# Patient Record
Sex: Male | Born: 1985 | Race: Black or African American | Hispanic: No | Marital: Single | Smoking: Current every day smoker
Health system: Southern US, Community
[De-identification: ages and names within clinical notes are randomized; demographics above are authoritative.]

## PROBLEM LIST (undated history)

## (undated) DIAGNOSIS — Z8719 Personal history of other diseases of the digestive system: Secondary | ICD-10-CM

## (undated) DIAGNOSIS — K269 Duodenal ulcer, unspecified as acute or chronic, without hemorrhage or perforation: Secondary | ICD-10-CM

## (undated) DIAGNOSIS — D649 Anemia, unspecified: Secondary | ICD-10-CM

## (undated) DIAGNOSIS — S025XXA Fracture of tooth (traumatic), initial encounter for closed fracture: Secondary | ICD-10-CM

## (undated) DIAGNOSIS — S12100A Unspecified displaced fracture of second cervical vertebra, initial encounter for closed fracture: Secondary | ICD-10-CM

## (undated) DIAGNOSIS — S022XXA Fracture of nasal bones, initial encounter for closed fracture: Secondary | ICD-10-CM

## (undated) DIAGNOSIS — Z9289 Personal history of other medical treatment: Secondary | ICD-10-CM

## (undated) DIAGNOSIS — A749 Chlamydial infection, unspecified: Secondary | ICD-10-CM

---

## 2012-05-02 DIAGNOSIS — A749 Chlamydial infection, unspecified: Secondary | ICD-10-CM

## 2012-05-02 HISTORY — DX: Chlamydial infection, unspecified: A74.9

## 2013-01-30 HISTORY — PX: APPENDECTOMY: SHX54

## 2013-03-04 ENCOUNTER — Emergency Department (HOSPITAL_COMMUNITY)
Admission: EM | Admit: 2013-03-04 | Discharge: 2013-03-04 | Disposition: A | Discharge status: Home / Self Care | Payer: BC Managed Care – PPO | Source: Home / Self Care | Attending: Emergency Medicine | Admitting: Emergency Medicine

## 2013-03-04 ENCOUNTER — Encounter (HOSPITAL_COMMUNITY): Payer: Self-pay | Admitting: Emergency Medicine

## 2013-03-04 ENCOUNTER — Other Ambulatory Visit (HOSPITAL_COMMUNITY)
Admission: RE | Admit: 2013-03-04 | Discharge: 2013-03-04 | Disposition: A | Discharge status: Home / Self Care | Payer: BC Managed Care – PPO | Source: Ambulatory Visit | Attending: Emergency Medicine | Admitting: Emergency Medicine

## 2013-03-04 DIAGNOSIS — Z113 Encounter for screening for infections with a predominantly sexual mode of transmission: Secondary | ICD-10-CM | POA: Insufficient documentation

## 2013-03-04 DIAGNOSIS — Z202 Contact with and (suspected) exposure to infections with a predominantly sexual mode of transmission: Secondary | ICD-10-CM

## 2013-03-04 NOTE — ED Notes (Signed)
Reports being treated for and std around the end of aug/early sept.    States at a separate time was told by partner that she tested positive for chlamydia.   Pt is is c/o of a weird sensation but denies penile discharge and any other symptoms.

## 2013-03-04 NOTE — ED Provider Notes (Signed)
Chief Complaint:   Chief Complaint  Patient presents with  . Exposure to STD    History of Present Illness:   Taylor Burke is a 27 year old male whose girlfriend told him in August that she tested positive for Chlamydia. They had been sexually active without protection. He went in and was tested and treated. He completed his course of treatment. At no time did he ever have any symptoms except for a mild sense of irritation at the tip of the penis. He denies any urethral discharge or dysuria. There have been no penile lesions, no inguinal lymphadenopathy, or testicular swelling or tenderness. He denies fevers, chills, skin rash, or joint pain. He has not had HIV or RPR testing done.  Review of Systems:  Other than noted above, the patient denies any of the following symptoms: Systemic:  No fevers chills, aches, weight loss, arthralgias, myalgias, or adenopathy. GI:  No abdominal pain, nausea or vomiting. GU:  No dysuria, penile pain, discharge, itching, dysuria, genital lesions, testicular pain or swelling. Skin:  No rash or itching.  PMFSH:  Past medical history, family history, social history, meds, and allergies were reviewed.  Physical Exam:   Vital signs:  BP 125/57  Pulse 60  Temp(Src) 98 F (36.7 C) (Oral)  Resp 16 Gen:  Alert, oriented, in no distress. Abdomen:  Soft and flat, non-distended, and non-tender.  No organomegaly or mass. Genital:  No urethral discharge, no lesions on the penis, no inguinal adenopathy, no testicular pain, mass, or swelling. Skin:  Warm and dry.  No rash.   Labs:  Urine DNA probe was obtained for gonorrhea, Chlamydia, Trichomonas. Blood serologies for HIV and syphilis also obtained.  Assessment:  The encounter diagnosis was Exposure to STD.  Since he has received adequate treatment, and since he is asymptomatic, I do not think he needs any treatment now, we'll wait and see what results of DNA probes and serologies show.  Plan:   1.  Meds:  The  following meds were prescribed:  There are no discharge medications for this patient.   2.  Patient Education/Counseling:  The patient was given appropriate handouts, self care instructions, and instructed in symptomatic relief.The patient was instructed to inform all sexual contacts, avoid intercourse completely for 2 weeks and then only with a condom.  The patient was told that we would call about all abnormal lab results, and that we would need to report certain kinds of infection to the health department.    3.  Follow up:  The patient was told to follow up if no better in 3 to 4 days, if becoming worse in any way, and given some red flag symptoms such as urethral discharge or pain with urination which would prompt immediate return.  Follow up here if necessary.     Reuben Likes, MD 03/04/13 [phone]

## 2013-03-08 NOTE — ED Notes (Signed)
Patient called to inquire about  lab reports , and after verifying ID,discussed negative findings ; Advised about safer sex practices

## 2014-01-20 ENCOUNTER — Emergency Department (HOSPITAL_COMMUNITY)
Admission: EM | Admit: 2014-01-20 | Discharge: 2014-01-20 | Discharge status: Against Medical Advice | Payer: BC Managed Care – PPO | Source: Home / Self Care

## 2014-01-20 ENCOUNTER — Encounter (HOSPITAL_COMMUNITY): Payer: Self-pay | Admitting: Emergency Medicine

## 2014-01-20 DIAGNOSIS — R0602 Shortness of breath: Secondary | ICD-10-CM | POA: Insufficient documentation

## 2014-01-20 DIAGNOSIS — K2981 Duodenitis with bleeding: Secondary | ICD-10-CM | POA: Diagnosis not present

## 2014-01-20 DIAGNOSIS — D62 Acute posthemorrhagic anemia: Secondary | ICD-10-CM | POA: Diagnosis not present

## 2014-01-20 DIAGNOSIS — F17200 Nicotine dependence, unspecified, uncomplicated: Secondary | ICD-10-CM | POA: Insufficient documentation

## 2014-01-20 NOTE — ED Notes (Signed)
While registration was in the room with pt, pt told her that he was leaving and was going to go somewhere else. Pt walked out of the room with no assistance from staff and left the hospital.

## 2014-01-20 NOTE — ED Notes (Signed)
Pt states that he vomited a small amount of blood today, he's appears to be hyperventilating in triage, no hx of asthma or respiratory problems. He has been like this since this evening.

## 2014-01-20 NOTE — ED Notes (Addendum)
Pt walked out into the hallway by himself and placed himself on the floor facedown. This RN and several other people went to assist patient to a wheelchair and back into a triage room. When inquired as to why patient was laying in the floor patient started to raise his voice and repeatedly cuss at the staff in the hallway. Pt talking in complete sentences and saying that he cannot breathe and asks "why are you asking stupid ass questions". Pt assisted back into a wheelchair and back into a triage room. Pt and visitor made aware that we are waiting on a room in the back and we will take him back to a room.

## 2014-01-21 ENCOUNTER — Inpatient Hospital Stay (HOSPITAL_COMMUNITY)
Admission: EM | Admit: 2014-01-21 | Discharge: 2014-01-24 | DRG: 378 | Disposition: A | Discharge status: Home / Self Care | Payer: BC Managed Care – PPO | Attending: Internal Medicine | Admitting: Internal Medicine

## 2014-01-21 ENCOUNTER — Inpatient Hospital Stay (HOSPITAL_COMMUNITY): Payer: BC Managed Care – PPO

## 2014-01-21 ENCOUNTER — Encounter (HOSPITAL_COMMUNITY): Payer: Self-pay | Admitting: Emergency Medicine

## 2014-01-21 DIAGNOSIS — Z9181 History of falling: Secondary | ICD-10-CM | POA: Diagnosis not present

## 2014-01-21 DIAGNOSIS — K264 Chronic or unspecified duodenal ulcer with hemorrhage: Secondary | ICD-10-CM | POA: Diagnosis present

## 2014-01-21 DIAGNOSIS — K5000 Crohn's disease of small intestine without complications: Secondary | ICD-10-CM | POA: Diagnosis present

## 2014-01-21 DIAGNOSIS — R55 Syncope and collapse: Secondary | ICD-10-CM | POA: Diagnosis present

## 2014-01-21 DIAGNOSIS — Z9289 Personal history of other medical treatment: Secondary | ICD-10-CM

## 2014-01-21 DIAGNOSIS — K2940 Chronic atrophic gastritis without bleeding: Secondary | ICD-10-CM | POA: Diagnosis present

## 2014-01-21 DIAGNOSIS — K2981 Duodenitis with bleeding: Principal | ICD-10-CM | POA: Diagnosis present

## 2014-01-21 DIAGNOSIS — J320 Chronic maxillary sinusitis: Secondary | ICD-10-CM | POA: Diagnosis present

## 2014-01-21 DIAGNOSIS — D62 Acute posthemorrhagic anemia: Secondary | ICD-10-CM | POA: Diagnosis present

## 2014-01-21 DIAGNOSIS — F17200 Nicotine dependence, unspecified, uncomplicated: Secondary | ICD-10-CM | POA: Diagnosis present

## 2014-01-21 DIAGNOSIS — S0990XA Unspecified injury of head, initial encounter: Secondary | ICD-10-CM

## 2014-01-21 DIAGNOSIS — K269 Duodenal ulcer, unspecified as acute or chronic, without hemorrhage or perforation: Secondary | ICD-10-CM

## 2014-01-21 DIAGNOSIS — D649 Anemia, unspecified: Secondary | ICD-10-CM

## 2014-01-21 DIAGNOSIS — K2990 Gastroduodenitis, unspecified, without bleeding: Secondary | ICD-10-CM

## 2014-01-21 DIAGNOSIS — Z7982 Long term (current) use of aspirin: Secondary | ICD-10-CM

## 2014-01-21 DIAGNOSIS — K2970 Gastritis, unspecified, without bleeding: Secondary | ICD-10-CM

## 2014-01-21 DIAGNOSIS — K922 Gastrointestinal hemorrhage, unspecified: Secondary | ICD-10-CM | POA: Diagnosis present

## 2014-01-21 DIAGNOSIS — R51 Headache: Secondary | ICD-10-CM | POA: Diagnosis present

## 2014-01-21 DIAGNOSIS — Z8719 Personal history of other diseases of the digestive system: Secondary | ICD-10-CM

## 2014-01-21 HISTORY — DX: Personal history of other medical treatment: Z92.89

## 2014-01-21 HISTORY — DX: Chlamydial infection, unspecified: A74.9

## 2014-01-21 HISTORY — DX: Personal history of other diseases of the digestive system: Z87.19

## 2014-01-21 HISTORY — DX: Anemia, unspecified: D64.9

## 2014-01-21 HISTORY — DX: Duodenal ulcer, unspecified as acute or chronic, without hemorrhage or perforation: K26.9

## 2014-01-21 LAB — CBC
HEMATOCRIT: 24.4 % — AB (ref 39.0–52.0)
HEMATOCRIT: 26.9 % — AB (ref 39.0–52.0)
Hemoglobin: 8.2 g/dL — ABNORMAL LOW (ref 13.0–17.0)
Hemoglobin: 9 g/dL — ABNORMAL LOW (ref 13.0–17.0)
MCH: 30 pg (ref 26.0–34.0)
MCH: 30.3 pg (ref 26.0–34.0)
MCHC: 33.6 g/dL (ref 30.0–36.0)
MCHC: 33.5 g/dL (ref 30.0–36.0)
MCV: 89.4 fL (ref 78.0–100.0)
MCV: 90.6 fL (ref 78.0–100.0)
Platelets: 164 10*3/uL (ref 150–400)
Platelets: 183 10*3/uL (ref 150–400)
RBC: 2.73 MIL/uL — ABNORMAL LOW (ref 4.22–5.81)
RBC: 2.97 MIL/uL — AB (ref 4.22–5.81)
RDW: 13.2 % (ref 11.5–15.5)
RDW: 13.1 % (ref 11.5–15.5)
WBC: 7.4 10*3/uL (ref 4.0–10.5)
WBC: 9.1 10*3/uL (ref 4.0–10.5)

## 2014-01-21 LAB — GLUCOSE, CAPILLARY
GLUCOSE-CAPILLARY: 75 mg/dL (ref 70–99)
Glucose-Capillary: 80 mg/dL (ref 70–99)

## 2014-01-21 LAB — COMPREHENSIVE METABOLIC PANEL
ALBUMIN: 2.7 g/dL — AB (ref 3.5–5.2)
ALT: 8 U/L (ref 0–53)
ALT: 7 U/L (ref 0–53)
AST: 12 U/L (ref 0–37)
AST: 9 U/L (ref 0–37)
Albumin: 3.1 g/dL — ABNORMAL LOW (ref 3.5–5.2)
Alkaline Phosphatase: 34 U/L — ABNORMAL LOW (ref 39–117)
Alkaline Phosphatase: 31 U/L — ABNORMAL LOW (ref 39–117)
Anion gap: 13 (ref 5–15)
Anion gap: 9 (ref 5–15)
BUN: 29 mg/dL — ABNORMAL HIGH (ref 6–23)
BUN: 20 mg/dL (ref 6–23)
CALCIUM: 8 mg/dL — AB (ref 8.4–10.5)
CALCIUM: 7.6 mg/dL — AB (ref 8.4–10.5)
CHLORIDE: 105 meq/L (ref 96–112)
CO2: 23 mEq/L (ref 19–32)
CO2: 25 mEq/L (ref 19–32)
CREATININE: 1.01 mg/dL (ref 0.50–1.35)
CREATININE: 0.88 mg/dL (ref 0.50–1.35)
Chloride: 108 mEq/L (ref 96–112)
GFR calc Af Amer: >90 mL/min (ref >90)
GFR calc non Af Amer: >90 mL/min (ref >90)
GLUCOSE: 115 mg/dL — AB (ref 70–99)
Glucose, Bld: 84 mg/dL (ref 70–99)
POTASSIUM: 3.7 meq/L (ref 3.7–5.3)
Potassium: 4.3 mEq/L (ref 3.7–5.3)
Sodium: 141 mEq/L (ref 137–147)
Sodium: 142 mEq/L (ref 137–147)
TOTAL PROTEIN: 4.6 g/dL — AB (ref 6.0–8.3)
Total Bilirubin: 0.5 mg/dL (ref 0.3–1.2)
Total Bilirubin: 0.6 mg/dL (ref 0.3–1.2)
Total Protein: 5.4 g/dL — ABNORMAL LOW (ref 6.0–8.3)

## 2014-01-21 LAB — URINALYSIS, ROUTINE W REFLEX MICROSCOPIC
BILIRUBIN URINE: NEGATIVE
GLUCOSE, UA: NEGATIVE mg/dL
Hgb urine dipstick: NEGATIVE
KETONES UR: 15 mg/dL — AB
Leukocytes, UA: NEGATIVE
Nitrite: NEGATIVE
PH: 7.5 (ref 5.0–8.0)
Protein, ur: NEGATIVE mg/dL
Specific Gravity, Urine: 1.019 (ref 1.005–1.030)
Urobilinogen, UA: 0.2 mg/dL (ref 0.0–1.0)

## 2014-01-21 LAB — RAPID URINE DRUG SCREEN, HOSP PERFORMED
AMPHETAMINES: NOT DETECTED
BARBITURATES: NOT DETECTED
BENZODIAZEPINES: NOT DETECTED
Cocaine: NOT DETECTED
Opiates: NOT DETECTED
Tetrahydrocannabinol: NOT DETECTED

## 2014-01-21 LAB — ABO/RH: ABO/RH(D): A POS

## 2014-01-21 LAB — CBC WITH DIFFERENTIAL/PLATELET
BASOS ABS: 0 10*3/uL (ref 0.0–0.1)
Basophils Relative: 0 % (ref 0–1)
Eosinophils Absolute: 0 10*3/uL (ref 0.0–0.7)
Eosinophils Relative: 0 % (ref 0–5)
HCT: 32 % — ABNORMAL LOW (ref 39.0–52.0)
HEMOGLOBIN: 10.7 g/dL — AB (ref 13.0–17.0)
Lymphocytes Relative: 9 % — ABNORMAL LOW (ref 12–46)
Lymphs Abs: 1.3 10*3/uL (ref 0.7–4.0)
MCH: 29.5 pg (ref 26.0–34.0)
MCHC: 33.4 g/dL (ref 30.0–36.0)
MCV: 88.2 fL (ref 78.0–100.0)
Monocytes Absolute: 1.2 10*3/uL — ABNORMAL HIGH (ref 0.1–1.0)
Monocytes Relative: 8 % (ref 3–12)
NEUTROS ABS: 12 10*3/uL — AB (ref 1.7–7.7)
NEUTROS PCT: 83 % — AB (ref 43–77)
PLATELETS: 171 10*3/uL (ref 150–400)
RBC: 3.63 MIL/uL — ABNORMAL LOW (ref 4.22–5.81)
RDW: 12.8 % (ref 11.5–15.5)
WBC: 14.5 10*3/uL — ABNORMAL HIGH (ref 4.0–10.5)

## 2014-01-21 LAB — MRSA PCR SCREENING: MRSA BY PCR: NEGATIVE

## 2014-01-21 LAB — PREPARE RBC (CROSSMATCH)

## 2014-01-21 LAB — LIPASE, BLOOD: LIPASE: 14 U/L (ref 11–59)

## 2014-01-21 LAB — I-STAT CG4 LACTIC ACID, ED: Lactic Acid, Venous: 2.42 mmol/L — ABNORMAL HIGH (ref 0.5–2.2)

## 2014-01-21 LAB — POC OCCULT BLOOD, ED: FECAL OCCULT BLD: POSITIVE — AB

## 2014-01-21 MED ORDER — ONDANSETRON HCL 4 MG PO TABS: 4.0000 mg | ORAL_TABLET | Freq: Four times a day (QID) | ORAL | Status: DC | PRN

## 2014-01-21 MED ORDER — SODIUM CHLORIDE 0.9 % IV SOLN
Freq: Once | INTRAVENOUS | Status: AC
  Administered 2014-01-21: 20 mL/h via INTRAVENOUS

## 2014-01-21 MED ORDER — SODIUM CHLORIDE 0.9 % IV BOLUS (SEPSIS)
1000.0000 mL | Freq: Once | INTRAVENOUS | Status: AC
  Administered 2014-01-21: 1000 mL via INTRAVENOUS

## 2014-01-21 MED ORDER — SODIUM CHLORIDE 0.9 % IV BOLUS (SEPSIS): 1000.0000 mL | Freq: Once | INTRAVENOUS | Status: DC

## 2014-01-21 MED ORDER — KETOROLAC TROMETHAMINE 30 MG/ML IJ SOLN
30.0000 mg | Freq: Once | INTRAMUSCULAR | Status: AC
  Administered 2014-01-21: 30 mg via INTRAVENOUS
  Filled 2014-01-21: qty 1

## 2014-01-21 MED ORDER — SODIUM CHLORIDE 0.9 % IV SOLN
Freq: Once | INTRAVENOUS | Status: AC
  Administered 2014-01-21: 10 mL/h via INTRAVENOUS

## 2014-01-21 MED ORDER — SODIUM CHLORIDE 0.9 % IV SOLN
8.0000 mg/h | INTRAVENOUS | Status: DC
  Administered 2014-01-21 – 2014-01-22 (×4): 8 mg/h via INTRAVENOUS
  Filled 2014-01-21 (×8): qty 80

## 2014-01-21 MED ORDER — ONDANSETRON HCL 4 MG/2ML IJ SOLN
4.0000 mg | Freq: Once | INTRAMUSCULAR | Status: AC
  Administered 2014-01-21: 4 mg via INTRAVENOUS
  Filled 2014-01-21: qty 2

## 2014-01-21 MED ORDER — ONDANSETRON HCL 4 MG/2ML IJ SOLN: 4.0000 mg | Freq: Four times a day (QID) | INTRAMUSCULAR | Status: DC | PRN

## 2014-01-21 MED ORDER — ACETAMINOPHEN 325 MG PO TABS
650.0000 mg | ORAL_TABLET | Freq: Four times a day (QID) | ORAL | Status: DC | PRN
  Administered 2014-01-21 (×2): 650 mg via ORAL
  Filled 2014-01-21 (×3): qty 2

## 2014-01-21 MED ORDER — SODIUM CHLORIDE 0.9 % IV SOLN
80.0000 mg | Freq: Once | INTRAVENOUS | Status: AC
  Administered 2014-01-21: 80 mg via INTRAVENOUS
  Filled 2014-01-21: qty 80

## 2014-01-21 MED ORDER — SODIUM CHLORIDE 0.9 % IV SOLN
INTRAVENOUS | Status: DC
  Administered 2014-01-22: 500 mL via INTRAVENOUS

## 2014-01-21 MED ORDER — ACETAMINOPHEN 650 MG RE SUPP: 650.0000 mg | Freq: Four times a day (QID) | RECTAL | Status: DC | PRN

## 2014-01-21 MED ORDER — SODIUM CHLORIDE 0.9 % IV SOLN
INTRAVENOUS | Status: AC
  Administered 2014-01-21 – 2014-01-22 (×3): via INTRAVENOUS

## 2014-01-21 NOTE — ED Notes (Signed)
Family at bedside. 

## 2014-01-21 NOTE — ED Notes (Signed)
Per EMS: pt coming from home with c/o abdominal pain, GI bleeding (vomiting and diarrhea), syncope, and hypotension. Pt was found in bathroom by family. Upon EMS arrival pt was pale, diaphoretic, gross amounts of bright red blood in toilet, BP 50 palpated. Pt went to [place] for similar symptoms, left without being seen. Pt reports abdominal pain for nine months. Pt now Pt A&Ox4, respirations equal and unlabored, skin cool and dry

## 2014-01-21 NOTE — H&P (Signed)
[facility] History and Physical  Taylor Burke LSL:[id number] DOB: Sep 26, 1985 DOA: 01/21/2014  Referring physician: ER physician. PCP: No primary provider on file.   Chief Complaint: Throwing up blood and loss of consciousness.  HPI: Taylor Burke is a 28 y.o. male with no significant past medical history started throwing up blood yesterday around noontime. Patient initially had gone to the [place] long ER but left back home. When he reached back home he again had a syncopal episode. He had no bowel movement which was dark and eventually bloody. Patient was feeling weak and tired. EMS was called and patient blood pressure was initially found to be in the 50 systolic which improved with IV fluid bolus. Patient's hemoglobin in the ER was around 10. Patient states that he also has been having some abdominal discomfort. Patient states for the last one week she has been having some headaches for which he is initially taken some aspirin and then he has been taking ibuprofen. Patient denies any previous history of GI bleed. Patient's headache is global. Patient on exam is nonfocal abdomen appears benign.   Review of Systems: As presented in the history of presenting illness, rest negative.  Past Medical History  Diagnosis Date  . Medical history non-contributory    Past Surgical History  Procedure Laterality Date  . Appendectomy     Social History:  reports that he has been smoking Cigarettes.  He has been smoking about 0.50 packs per day. He does not have any smokeless tobacco history on file. He reports that he does not drink alcohol or use illicit drugs. Where does patient live home. Can patient participate in ADLs? Yes.  No Known Allergies  Family History:  Family History  Problem Relation Age of Onset  . Colon cancer Neg Hx       Prior to Admission medications   Medication Sig Start Date End Date Taking? Authorizing Provider  acetaminophen (TYLENOL) 325 MG tablet Take  650 mg by mouth every 6 (six) hours as needed for moderate pain.   Yes Historical Provider, MD  aspirin 325 MG tablet Take 975 mg by mouth every 6 (six) hours as needed for moderate pain.   Yes Historical Provider, MD  ibuprofen (ADVIL,MOTRIN) 200 MG tablet Take 400 mg by mouth every 6 (six) hours as needed for moderate pain.   Yes Historical Provider, MD    Physical Exam: Filed Vitals:   01/21/14 1157 01/21/14 0343 01/21/14 0345 01/21/14 0347  BP: 93/39 89/34 85/33  82/30  Pulse: 84 81 82 77  Resp: 23 13 15 16   Height:      Weight:      SpO2: 100% 100% 99% 100%     General:  Well-developed and nourished.  Eyes: Anicteric no pallor.  ENT: No discharge from the ears eyes nose mouth.  Neck: No mass felt.  Cardiovascular: S1-S2 heard.  Respiratory: No rhonchi or crepitations.  Abdomen: Soft nontender bowel sounds present. No guarding or rigidity.  Skin: No rash.  Musculoskeletal: No edema.  Psychiatric: Appears normal.  Neurologic: Alert awake oriented to time place and person. Moves all extremities 5 x 5.  Labs on Admission:  Basic Metabolic Panel:  Recent Labs Lab 01/21/14 0128  NA 141  K 4.3  CL 105  CO2 23  GLUCOSE 115*  BUN 29*  CREATININE 1.01  CALCIUM 8.0*   Liver Function Tests:  Recent Labs Lab 01/21/14 0128  AST 12  ALT 8  ALKPHOS 34*  BILITOT 0.5  PROT 5.4*  ALBUMIN 3.1*    Recent Labs Lab 01/21/14 0128  LIPASE 14   No results found for this basename: AMMONIA,  in the last 168 hours CBC:  Recent Labs Lab 01/21/14 0128  WBC 14.5*  NEUTROABS 12.0*  HGB 10.7*  HCT 32.0*  MCV 88.2  PLT 171   Cardiac Enzymes: No results found for this basename: CKTOTAL, CKMB, CKMBINDEX, TROPONINI,  in the last 168 hours  BNP (last 3 results) No results found for this basename: PROBNP,  in the last 8760 hours CBG: No results found for this basename: GLUCAP,  in the last 168 hours  Radiological Exams on Admission: No results  found.  Assessment/Plan Principal Problem:   Acute GI bleeding Active Problems:   Acute blood loss anemia   1. Acute GI bleed - most likely upper GI. The patient also has been having passing some blood through rectum also. At this time we will keep patient n.p.o. and patient has been already started on Protonix infusion. Continue with aggressive IV fluid hydration and closely follow CBC. Transfuse if hemoglobin falls less than 7 or if patient becomes hypotensive. Consult GI. 2. Acute blood loss anemia - closely follow CBC. 3. Leukocytosis - probably reactionary. 4. Headaches - will need further workup after stabilization of his GI bleed. 5. Syncope - probably from #1. 6. Tobacco abuse - tobacco cessation counseling requested.    Code Status: Full code.  Family Communication: Patient's dad at the bedside.  Disposition Plan: Admit to inpatient.    Sherlynn Tourville N. [facility] Pager [phone].  If 7PM-7AM, please contact night-coverage [email or web address] Password [facility] 01/21/2014, 3:52 AM

## 2014-01-21 NOTE — ED Provider Notes (Signed)
CSN: [id number]     Arrival date & time 01/21/14  0119 History   First MD Initiated Contact with Patient 01/21/14 0130     Chief Complaint  Patient presents with  . Abdominal Pain  . GI Bleeding  . Hypotension     (Consider location/radiation/quality/duration/timing/severity/associated sxs/prior Treatment) Patient is a 28 y.o. male presenting with abdominal pain. The history is provided by the patient and the EMS personnel.  Abdominal Pain He was brought to the ED by Mitzi Hansen ends after having a syncopal episode at home. He had been vomiting blood and having black, tarry bowel movements at home. Emesis was bright red blood with clots. He has states that he started having upper abdominal pain and bloody emesis and black bowel movements at about 5 PM. There was some radiation of pain into his back. He's been having headaches for the last week and has been taking aspirin and ibuprofen intermittently but it is not sound like he had a large dose of either. He went to [facility] emergency department but left there before being seen by a physician. At home, and he had a syncopal episode in the bathroom. EMS noted that he was diaphoretic and hypotensive with blood pressure in the 50s and he was noted to be tachycardic. He has been given IV fluids with improvement in blood pressure and heart rate and is no longer diaphoretic. He has been having problems with abdominal pain over the last year or related to a complicated case of appendicitis. He drinks occasionally and smokes black and mild cigars occasionally. He denies any routine medication and denies drug use.  No past medical history on file. No past surgical history on file. No family history on file. History  Substance Use Topics  . Smoking status: Current Every Day Smoker -- 0.50 packs/day    Types: Cigarettes  . Smokeless tobacco: Not on file  . Alcohol Use: No    Review of Systems  Gastrointestinal: Positive for abdominal pain.   All other systems reviewed and are negative.     Allergies  Review of patient's allergies indicates no known allergies.  Home Medications   Prior to Admission medications   Medication Sig Start Date End Date Taking? Authorizing Provider  acetaminophen (TYLENOL) 325 MG tablet Take 650 mg by mouth every 6 (six) hours as needed for moderate pain.    Historical Provider, MD  aspirin 325 MG tablet Take 975 mg by mouth every 6 (six) hours as needed for moderate pain.    Historical Provider, MD  ibuprofen (ADVIL,MOTRIN) 200 MG tablet Take 400 mg by mouth every 6 (six) hours as needed for moderate pain.    Historical Provider, MD   BP 99/55  Pulse 86  Resp 14  Ht 6' (1.829 m)  Wt 176 lb (79.833 kg)  BMI 23.86 kg/m2  SpO2 100% Physical Exam  Nursing note and vitals reviewed.  28 year old male, resting comfortably and in no acute distress. Vital signs are significant for borderline hypertension. He is noted to be shivering but is not diaphoretic. Oxygen saturation is 100%, which is normal. Head is normocephalic and atraumatic. PERRLA, EOMI. Oropharynx is clear. Neck is nontender and supple without adenopathy or JVD. Back is nontender and there is no CVA tenderness. Lungs are clear without rales, wheezes, or rhonchi. Chest is nontender. Heart has regular rate and rhythm without murmur. Abdomen is soft, flat, with mild epigastric tenderness. There is no rebound or guarding. There are no masses or  hepatosplenomegaly and peristalsis is normoactive. Rectal: Decreased sphincter tone, small amount of bright red blood present. Extremities have no cyanosis or edema, full range of motion is present. Skin is warm and dry without rash. Neurologic: Mental status is normal, cranial nerves are intact, there are no motor or sensory deficits.  ED Course  Procedures (including critical care time) Labs Review Results for orders placed during the hospital encounter of 01/21/14  CBC WITH DIFFERENTIAL       Result Value Ref Range   WBC 14.5 (*) 4.0 - 10.5 K/uL   RBC 3.63 (*) 4.22 - 5.81 MIL/uL   Hemoglobin 10.7 (*) 13.0 - 17.0 g/dL   HCT 32.0 (*) 39.0 - 52.0 %   MCV 88.2  78.0 - 100.0 fL   MCH 29.5  26.0 - 34.0 pg   MCHC 33.4  30.0 - 36.0 g/dL   RDW 12.8  11.5 - 15.5 %   Platelets 171  150 - 400 K/uL   Neutrophils Relative % 83 (*) 43 - 77 %   Neutro Abs 12.0 (*) 1.7 - 7.7 K/uL   Lymphocytes Relative 9 (*) 12 - 46 %   Lymphs Abs 1.3  0.7 - 4.0 K/uL   Monocytes Relative 8  3 - 12 %   Monocytes Absolute 1.2 (*) 0.1 - 1.0 K/uL   Eosinophils Relative 0  0 - 5 %   Eosinophils Absolute 0.0  0.0 - 0.7 K/uL   Basophils Relative 0  0 - 1 %   Basophils Absolute 0.0  0.0 - 0.1 K/uL  LIPASE, BLOOD      Result Value Ref Range   Lipase 14  11 - 59 U/L  COMPREHENSIVE METABOLIC PANEL      Result Value Ref Range   Sodium 141  137 - 147 mEq/L   Potassium 4.3  3.7 - 5.3 mEq/L   Chloride 105  96 - 112 mEq/L   CO2 23  19 - 32 mEq/L   Glucose, Bld 115 (*) 70 - 99 mg/dL   BUN 29 (*) 6 - 23 mg/dL   Creatinine, Ser 1.01  0.50 - 1.35 mg/dL   Calcium 8.0 (*) 8.4 - 10.5 mg/dL   Total Protein 5.4 (*) 6.0 - 8.3 g/dL   Albumin 3.1 (*) 3.5 - 5.2 g/dL   AST 12  0 - 37 U/L   ALT 8  0 - 53 U/L   Alkaline Phosphatase 34 (*) 39 - 117 U/L   Total Bilirubin 0.5  0.3 - 1.2 mg/dL   GFR calc non Af Amer >90  >90 mL/min   GFR calc Af Amer >90  >90 mL/min   Anion gap 13  5 - 15  I-STAT CG4 LACTIC ACID, ED      Result Value Ref Range   Lactic Acid, Venous 2.42 (*) 0.5 - 2.2 mmol/L  POC OCCULT BLOOD, ED      Result Value Ref Range   Fecal Occult Bld POSITIVE (*) NEGATIVE  TYPE AND SCREEN      Result Value Ref Range   ABO/RH(D) A POS     Antibody Screen NEG     Sample Expiration 01/24/2014    ABO/RH      Result Value Ref Range   ABO/RH(D) A POS      EKG Interpretation   Date/Time:  Tuesday January 21 2014 01:27:42 EDT Ventricular Rate:  93 PR Interval:  136 QRS Duration: 82 QT Interval:   340 QTC Calculation: 423 R Axis:  34 Text Interpretation:  Age not entered, assumed to be  29 years old for  purpose of ECG interpretation Sinus rhythm Abnormal R-wave progression,  early transition Early repolarization No old tracing to compare Confirmed  by [facility]  MD, Latrina Guttman ([id number]) on 01/21/2014 1:39:51 AM      CRITICAL CARE Performed by: LYYTK,PTWSF Total critical care time: 45 minutes Critical care time was exclusive of separately billable procedures and treating other patients. Critical care was necessary to treat or prevent imminent or life-threatening deterioration. Critical care was time spent personally by me on the following activities: development of treatment plan with patient and/or surrogate as well as nursing, discussions with consultants, evaluation of patient's response to treatment, examination of patient, obtaining history from patient or surrogate, ordering and performing treatments and interventions, ordering and review of laboratory studies, ordering and review of radiographic studies, pulse oximetry and re-evaluation of patient's condition.  MDM   Final diagnoses:  Upper gastrointestinal bleed    Upper GI bleed with episode of hypotension. Blood pressure and heart rate are adequate in the ED currently. He is started on pantoprazole intravenously. Old records were reviewed and he was tachycardic when at the ED at [facility], but was not hypotensive.  He has been hemodynamically stable in the ED. No further episodes of hypotension or tachycardia. Hemoglobin is somewhat low at 10.7 without any baseline for comparison., In an otherwise healthy male, would expect hemoglobin to be over 14 at baseline. I do not see an indication for emergent endoscopy. Case is discussed with Dr. Hal Hope outside hospital as he agrees to admit the patient to step down unit.  Delora Fuel, MD 68/12/75 1700

## 2014-01-21 NOTE — ED Notes (Signed)
Pt alert, NAD, calm, interactive, resps e/u, speaking in clear complete sentences, family at [facility] x2. C/o HA and feeling weak. (Denies: sob other pain, nausea or dizziness). IVF bolus initiated/ infusing.

## 2014-01-21 NOTE — Progress Notes (Signed)
[place] TEAM 1 - Stepdown/ICU TEAM Progress Note  Taylor Burke PNT:[id number] DOB: [birth year] DOA: 01/21/2014 PCP: No PCP Per Patient  Admit HPI / Brief Narrative: Taylor Burke is a 28 y.o. BM PMHx S./P. appendectomy (possible polyps?) Surgery performed in [place]. Started throwing up blood yesterday around noontime. Patient initially had gone to the [place] long ER but left back home. When he reached back home he again had a syncopal episode. He had no bowel movement which was dark and eventually bloody. Patient was feeling weak and tired. EMS was called and patient blood pressure was initially found to be in the 50 systolic which improved with IV fluid bolus. Patient's hemoglobin in the ER was around 10. Patient states that he also has been having some abdominal discomfort. Patient states for the last one week she has been having some headaches for which he is initially taken some aspirin and then he has been taking ibuprofen. Patient denies any previous history of GI bleed. Patient's headache is global. Patient on exam is nonfocal abdomen appears benign.    HPI/Subjective: 9/22 A/O. x4, continued feeling of dizziness when sitting/standing. States multiple syncopal events yesterday after returning from [facility]. States he struck his head multiple times, currently C./O HA.  Assessment/Plan: Symptomatic GI bleed/acute blood loss anemia -Have consulted GI. Counseled patient that most likely will require colonoscopy in the a.m. following bowel prep tonight. -Continue normal saline at 170ml/hr -Continue Protonix drip -Type and cross -H./H. QID, target hemoglobin > 7.  Multiple head trauma -Head CT  Headache -Toradol 30 mg IV x1     Code Status: FULL Family Communication: no family present at time of exam Disposition Plan: Resolution of GI bleed    Consultants: GI pending  Procedure/Significant Events: Head CT  pending   Culture NA  Antibiotics: NA  DVT prophylaxis: SCD   Devices NA   LINES / TUBES:      Continuous Infusions: . sodium chloride 150 mL/hr at 01/21/14 0900  . pantoprozole (PROTONIX) infusion 8 mg/hr (01/21/14 0600)    Objective: VITAL SIGNS: Temp: 98.6 F (37 C) (09/22 0741) Temp src: Oral (09/22 0741) BP: 108/48 mmHg (09/22 1000) Pulse Rate: 58 (09/22 1000) SPO2;100% on RA FIO2:   Intake/Output Summary (Last 24 hours) at 01/21/14 1210 Last data filed at 01/21/14 0602  Gross per 24 hour  Intake   2675 ml  Output    120 ml  Net   2555 ml     Exam: General: A/O x 4, Dizzy with sitting/standing, No acute respiratory distress Lungs: Clear to auscultation bilaterally without wheezes or crackles Cardiovascular: Regular rate and rhythm without murmur gallop or rub normal S1 and S2 Abdomen: Nontender, nondistended, soft, bowel sounds positive, no rebound, no ascites, no appreciable mass Extremities: No significant cyanosis, clubbing, or edema bilateral lower extremities Neurologic; pupils equal round reactive to light and accommodation, tongue/uvula midline, extremities strength 5/5, sensation intact, quick finger touch is within normal limit, did not ambulate patient secondary to presyncope.  Data Reviewed: Basic Metabolic Panel:  Recent Labs Lab 01/21/14 0128 01/21/14 1015  NA 141 142  K 4.3 3.7  CL 105 108  CO2 23 25  GLUCOSE 115* 84  BUN 29* 20  CREATININE 1.01 0.88  CALCIUM 8.0* 7.6*   Liver Function Tests:  Recent Labs Lab 01/21/14 0128 01/21/14 1015  AST 12 9  ALT 8 7  ALKPHOS 34* 31*  BILITOT 0.5 0.6  PROT 5.4* 4.6*  ALBUMIN 3.1* 2.7*  Recent Labs Lab 01/21/14 0128  LIPASE 14   No results found for this basename: AMMONIA,  in the last 168 hours CBC:  Recent Labs Lab 01/21/14 0128 01/21/14 1015  WBC 14.5* 7.4  NEUTROABS 12.0*  --   HGB 10.7* 8.2*  HCT 32.0* 24.4*  MCV 88.2 89.4  PLT 171 164   Cardiac  Enzymes: No results found for this basename: CKTOTAL, CKMB, CKMBINDEX, TROPONINI,  in the last 168 hours BNP (last 3 results) No results found for this basename: PROBNP,  in the last 8760 hours CBG: No results found for this basename: GLUCAP,  in the last 168 hours  Recent Results (from the past 240 hour(s))  MRSA PCR SCREENING     Status: None   Collection Time    01/21/14  5:45 AM      Result Value Ref Range Status   MRSA by PCR NEGATIVE  NEGATIVE Final   Comment:            The GeneXpert MRSA Assay (FDA     approved for NASAL specimens     only), is one component of a     comprehensive MRSA colonization     surveillance program. It is not     intended to diagnose MRSA     infection nor to guide or     monitor treatment for     MRSA infections.     Studies:  Recent x-ray studies have been reviewed in detail by the Attending Physician  Scheduled Meds:  Scheduled Meds:   Time spent on care of this patient: 40 mins   Allie Bossier , MD   [facility] Office  [phone] Pager - [phone]  On-Call/Text Page:      [email or web address]      password [email or web address]  If 7PM-7AM, please contact night-coverage [email or web address] Password [email or web address] 01/21/2014, 12:10 PM   LOS: 0 days

## 2014-01-21 NOTE — Progress Notes (Signed)
Utilization Review Completed.Donne Anon T9/22/2015

## 2014-01-21 NOTE — Progress Notes (Signed)
Pt just got a bed on 3W, pt just had a bloody bm and his hgb was dropped since this am; another cbc was just drawn; awaiting results before I transfer patient; md notified

## 2014-01-21 NOTE — Consult Note (Signed)
Referring Provider: [facility] Primary Care Physician:  No PCP Per Patient Primary Gastroenterologist:  unassigned  Reason for Consultation:  Anemia, hematemesis   HPI: Taylor Burke is a 28 y.o. male admitted through the ER due to hematemesis, anemia, and a syncopal episode. Pt reports a 9 month hx of intermittent abdominal pain. He says he threw up blood yesterday around 9 pm. He went to [facility] ER but left withoutbeing seen.  He went home and had a syncopal episode. He then had a BM that was dark and then bloody.  EMS was called and pt was found to have a systolic BP of 50. Pt says for the past week he has been having headaches and has used aspirin and ibuprofen.Pts Hgb is 8.2.      Pt says that last Oct or Nov he had RLQ pain and was seen at [facility] in [place], [place]. He says he had his appendix out, and "something else near it was messed up and they took it out". His significant other thinks it was the iliocecal valve. He says he has had RLQ pain since. He was seen in [place] in Feb 2016 and had a CT scan: 1. There is increased density in the fat in the left paracolic gutter anterior to the descending colon without abnormality descending colon or small bowel. This is not free fluid and is not a mass with mass effect. Suggest short-term 3 month followup  exam.     Past Medical History  Diagnosis Date  . Medical history non-contributory     Past Surgical History  Procedure Laterality Date  . Appendectomy      Prior to Admission medications   Medication Sig Start Date End Date Taking? Authorizing Provider  acetaminophen (TYLENOL) 325 MG tablet Take 650 mg by mouth every 6 (six) hours as needed for moderate pain.   Yes Historical Provider, MD  aspirin 325 MG tablet Take 975 mg by mouth every 6 (six) hours as needed for moderate pain.   Yes Historical Provider, MD  ibuprofen (ADVIL,MOTRIN) 200 MG tablet Take 400 mg by mouth every 6 (six) hours as needed for  moderate pain.   Yes Historical Provider, MD    Current Facility-Administered Medications  Medication Dose Route Frequency Provider Last Rate Last Dose  . 0.9 %  sodium chloride infusion   Intravenous Continuous Rise Patience, MD 150 mL/hr at 01/21/14 0900    . acetaminophen (TYLENOL) tablet 650 mg  650 mg Oral Q6H PRN Rise Patience, MD   650 mg at 01/21/14 0919   Or  . acetaminophen (TYLENOL) suppository 650 mg  650 mg Rectal Q6H PRN Rise Patience, MD      . ondansetron [facility]) tablet 4 mg  4 mg Oral Q6H PRN Rise Patience, MD       Or  . ondansetron [facility]) injection 4 mg  4 mg Intravenous Q6H PRN Rise Patience, MD      . pantoprazole (PROTONIX) 80 mg in sodium chloride 0.9 % 250 mL (0.32 mg/mL) infusion  8 mg/hr Intravenous Continuous Delora Fuel, MD 25 mL/hr at 01/21/14 1236 8 mg/hr at 01/21/14 1236    Allergies as of 01/21/2014  . (No Known Allergies)    Family History  Problem Relation Age of Onset  . Colon cancer Neg Hx     History   Social History  . Marital Status: Single    Spouse Name: N/A    Number of Children:  N/A  . Years of Education: N/A   Occupational History  . Not on file.   Social History Main Topics  . Smoking status: Current Every Day Smoker -- 0.50 packs/day    Types: Cigarettes  . Smokeless tobacco: Not on file  . Alcohol Use: No  . Drug Use: No  . Sexual Activity: Yes    Birth Control/ Protection: None   Other Topics Concern  . Not on file   Social History Narrative  . No narrative on file    Review of Systems: Gen: Denies any fever, chills, sweats, anorexia, fatigue, weakness, malaise, weight loss, and sleep disorder CV: Denies chest pain, angina, palpitations, syncope, orthopnea, PND, peripheral edema, and claudication. Resp: Denies dyspnea at rest, dyspnea with exercise, cough, sputum, wheezing,, and pleurisy. GI: Denies, jaundice, and fecal incontinence.   Denies dysphagia or odynophagia.Pt has vomited  blood. GU : Denies urinary burning, blood in urine, urinary frequency, urinary hesitancy, nocturnal urination, and urinary incontinence. MS: Denies joint pain, limitation of movement, and swelling, stiffness, low back pain, extremity pain. Denies muscle weakness, cramps, atrophy.  Derm: Denies rash, itching, dry skin, hives, moles, warts, or unhealing ulcers.  Psych: Denies depression, anxiety, memory loss, suicidal ideation, hallucinations, paranoia, and confusion. Heme: Denies bruising, bleeding, and enlarged lymph nodes. Neuro:  Denies any headaches, dizziness, paresthesias. Endo:  Denies any problems with DM, thyroid, adrenal function.  Physical Exam: Vital signs in last 24 hours: Temp:  [98.3 F (36.8 C)-98.6 F (37 C)] 98.5 F (36.9 C) (09/22 1200) Pulse Rate:  [58-126] 67 (09/22 1200) Resp:  [9-36] 16 (09/22 1200) BP: (82-140)/(25-97) 96/47 mmHg (09/22 1200) SpO2:  [98 %-100 %] 100 % (09/22 1200) Weight:  [166 lb 10.7 oz (75.6 kg)-176 lb (79.833 kg)] 166 lb 10.7 oz (75.6 kg) (09/22 0545) Last BM Date: 01/21/14 General:   Alert,  Well-developed, well-nourished, pleasant and cooperative in NAD Head:  Normocephalic and atraumatic. Eyes:  Sclera clear, no icterus.   Conjunctiva pink. Ears:  Normal auditory acuity. Nose:  No deformity, discharge,  or lesions. Mouth:  No deformity or lesions.   Neck:  Supple; no masses or thyromegaly. Lungs:  Clear throughout to auscultation.   No wheezes, crackles, or rhonchi. Heart:  Regular rate and rhythm; no murmurs, clicks, rubs,  or gallops. Abdomen:  Soft,nontender, BS active,nonpalp mass or hsm.   Rectal:  Deferred  Msk:  Symmetrical without gross deformities. . Pulses:  Normal pulses noted. Extremities:  Without clubbing or edema. Neurologic:  Alert and  oriented x4;  grossly normal neurologically. Skin:  Intact without significant lesions or rashes.. Psych:  Alert and cooperative. Normal mood and affect.  Intake/Output from previous  day: 09/21 0701 - 09/22 0700 In: 2675 [I.V.:1675; IV Piggyback:1000] Out: 120 [Urine:120] Intake/Output this shift:    Lab Results:  Recent Labs  01/21/14 0128 01/21/14 1015  WBC 14.5* 7.4  HGB 10.7* 8.2*  HCT 32.0* 24.4*  PLT 171 164  CBC fron 06/17/13 Hgb 14 HCT 42.5   BMET  Recent Labs  01/21/14 0128 01/21/14 1015  NA 141 142  K 4.3 3.7  CL 105 108  CO2 23 25  GLUCOSE 115* 84  BUN 29* 20  CREATININE 1.01 0.88  CALCIUM 8.0* 7.6*   LFT  Recent Labs  01/21/14 1015  PROT 4.6*  ALBUMIN 2.7*  AST 9  ALT 7  ALKPHOS 31*  BILITOT 0.6     Studies/Results: CT abd from [facility] on2/16/15: IMPRESSION: 1. There is increased density  in the fat in the left paracolic gutter anterior to the descending colon without abnormality descending colon or small bowel. This is not free fluid and is not a mass with mass effect. Suggest short-term 3 month followup  exam. 2. Otherwise negative exam     IMPRESSION/PLAN: 1. Acute GI bleed with hematemesis and subsequent dark stool. Likely UGI bleed, ? If due to NSAIDS causing gastritis or ulcers, ? Mallory-Weiss tear? Continue IVF. Monitor H/H. Pt to be scheduled for EGD tomorrow morning. Continue protonix infusion. Will have pt sign med release to try to get records from [facility] regarding previous surgery.Orders for additional 1000cc bolus discontinued as it would likely result in further dilutional anemia. Pt currently stable. 2.Syncope. Likely due toUGI bleed.      Hvozdovic, Deloris Ping 01/21/2014,  Pager [phone]   ________________________________________________________________________  Velora Heckler GI MD note:  I personally examined the patient, reviewed the data and agree with the assessment and plan described above.  HAs had melena this afternoon twice.  HD stable. Was put in for transfer to step down but I think he should stay in ICU for now.  I've ordered two units blood transfusion to be given.  Planning on EGD  tomorrow, earlier if needed but he's been HD stable since initial rescusitation and so I think he is unlikely to be actively bleeding.   Owens Loffler, MD [facility] Pager [phone]

## 2014-01-21 NOTE — ED Notes (Signed)
MD at bedside. 

## 2014-01-22 ENCOUNTER — Encounter (HOSPITAL_COMMUNITY)
Admission: EM | Disposition: A | Discharge status: Home / Self Care | Payer: Self-pay | Source: Home / Self Care | Attending: Internal Medicine

## 2014-01-22 ENCOUNTER — Encounter (HOSPITAL_COMMUNITY): Payer: BC Managed Care – PPO | Admitting: Anesthesiology

## 2014-01-22 ENCOUNTER — Inpatient Hospital Stay (HOSPITAL_COMMUNITY): Payer: BC Managed Care – PPO | Admitting: Anesthesiology

## 2014-01-22 ENCOUNTER — Encounter (HOSPITAL_COMMUNITY): Payer: Self-pay | Admitting: *Deleted

## 2014-01-22 DIAGNOSIS — K269 Duodenal ulcer, unspecified as acute or chronic, without hemorrhage or perforation: Secondary | ICD-10-CM

## 2014-01-22 DIAGNOSIS — K2990 Gastroduodenitis, unspecified, without bleeding: Secondary | ICD-10-CM

## 2014-01-22 DIAGNOSIS — K2970 Gastritis, unspecified, without bleeding: Secondary | ICD-10-CM

## 2014-01-22 DIAGNOSIS — K2981 Duodenitis with bleeding: Secondary | ICD-10-CM

## 2014-01-22 HISTORY — PX: ESOPHAGOGASTRODUODENOSCOPY: SHX5428

## 2014-01-22 LAB — TYPE AND SCREEN
ABO/RH(D): A POS
Antibody Screen: NEGATIVE
UNIT DIVISION: 0
Unit division: 0

## 2014-01-22 LAB — CBC
HCT: 25.7 % — ABNORMAL LOW (ref 39.0–52.0)
HCT: 26.6 % — ABNORMAL LOW (ref 39.0–52.0)
Hemoglobin: 8.6 g/dL — ABNORMAL LOW (ref 13.0–17.0)
Hemoglobin: 9 g/dL — ABNORMAL LOW (ref 13.0–17.0)
MCH: 30 pg (ref 26.0–34.0)
MCH: 30.5 pg (ref 26.0–34.0)
MCHC: 33.5 g/dL (ref 30.0–36.0)
MCHC: 33.8 g/dL (ref 30.0–36.0)
MCV: 89.5 fL (ref 78.0–100.0)
MCV: 90.2 fL (ref 78.0–100.0)
PLATELETS: 138 10*3/uL — AB (ref 150–400)
PLATELETS: 150 10*3/uL (ref 150–400)
RBC: 2.87 MIL/uL — AB (ref 4.22–5.81)
RBC: 2.95 MIL/uL — AB (ref 4.22–5.81)
RDW: 13.1 % (ref 11.5–15.5)
RDW: 13.1 % (ref 11.5–15.5)
WBC: 5.8 10*3/uL (ref 4.0–10.5)
WBC: 6.8 10*3/uL (ref 4.0–10.5)

## 2014-01-22 LAB — GLUCOSE, CAPILLARY
GLUCOSE-CAPILLARY: 80 mg/dL (ref 70–99)
Glucose-Capillary: 95 mg/dL (ref 70–99)

## 2014-01-22 SURGERY — EGD (ESOPHAGOGASTRODUODENOSCOPY)
Anesthesia: Monitor Anesthesia Care

## 2014-01-22 MED ORDER — MORPHINE SULFATE 2 MG/ML IJ SOLN
1.0000 mg | Freq: Once | INTRAMUSCULAR | Status: AC
Start: 2014-01-22 — End: 2014-01-22
  Administered 2014-01-22: 1 mg via INTRAVENOUS
  Filled 2014-01-22: qty 1

## 2014-01-22 MED ORDER — DEXTROSE-NACL 5-0.9 % IV SOLN
INTRAVENOUS | Status: DC
  Administered 2014-01-22: 150 mL/h via INTRAVENOUS
  Administered 2014-01-22: 15:00:00 via INTRAVENOUS

## 2014-01-22 MED ORDER — SODIUM CHLORIDE 0.9 % IV SOLN
INTRAVENOUS | Status: DC | PRN
  Administered 2014-01-22: 14:00:00 via INTRAVENOUS

## 2014-01-22 MED ORDER — TRAMADOL HCL 50 MG PO TABS
50.0000 mg | ORAL_TABLET | Freq: Four times a day (QID) | ORAL | Status: DC | PRN
  Administered 2014-01-22 – 2014-01-23 (×3): 50 mg via ORAL
  Filled 2014-01-22 (×3): qty 1

## 2014-01-22 MED ORDER — MIDAZOLAM HCL 5 MG/5ML IJ SOLN
INTRAMUSCULAR | Status: DC | PRN
  Administered 2014-01-22: 2 mg via INTRAVENOUS

## 2014-01-22 MED ORDER — ONDANSETRON HCL 4 MG/2ML IJ SOLN: 4.0000 mg | Freq: Once | INTRAMUSCULAR | Status: DC | PRN

## 2014-01-22 MED ORDER — LIDOCAINE HCL (CARDIAC) 20 MG/ML IV SOLN
INTRAVENOUS | Status: DC | PRN
  Administered 2014-01-22: 30 mg via INTRAVENOUS

## 2014-01-22 MED ORDER — SODIUM CHLORIDE 0.9 % IV SOLN
INTRAVENOUS | Status: DC
  Administered 2014-01-22 (×2): via INTRAVENOUS
  Administered 2014-01-23: 1000 mL via INTRAVENOUS

## 2014-01-22 MED ORDER — OXYCODONE HCL 5 MG PO TABS
5.0000 mg | ORAL_TABLET | ORAL | Status: DC | PRN
  Administered 2014-01-22 – 2014-01-23 (×2): 5 mg via ORAL
  Filled 2014-01-22 (×2): qty 1

## 2014-01-22 MED ORDER — INFLUENZA VAC SPLIT QUAD 0.5 ML IM SUSY
0.5000 mL | PREFILLED_SYRINGE | INTRAMUSCULAR | Status: AC
  Administered 2014-01-23: 0.5 mL via INTRAMUSCULAR
  Filled 2014-01-22: qty 0.5

## 2014-01-22 MED ORDER — PROPOFOL 10 MG/ML IV BOLUS
INTRAVENOUS | Status: DC | PRN
  Administered 2014-01-22: 30 mg via INTRAVENOUS

## 2014-01-22 MED ORDER — FENTANYL CITRATE 0.05 MG/ML IJ SOLN
INTRAMUSCULAR | Status: DC | PRN
  Administered 2014-01-22 (×2): 50 ug via INTRAVENOUS

## 2014-01-22 MED ORDER — PANTOPRAZOLE SODIUM 40 MG PO TBEC
40.0000 mg | DELAYED_RELEASE_TABLET | Freq: Two times a day (BID) | ORAL | Status: DC
  Administered 2014-01-22 – 2014-01-24 (×4): 40 mg via ORAL
  Filled 2014-01-22 (×4): qty 1

## 2014-01-22 MED ORDER — HYDROMORPHONE HCL 1 MG/ML IJ SOLN: 0.2500 mg | INTRAMUSCULAR | Status: DC | PRN

## 2014-01-22 NOTE — Anesthesia Postprocedure Evaluation (Signed)
  Anesthesia Post-op Note  Patient: Taylor Burke  Procedure(s) Performed: Procedure(s): ESOPHAGOGASTRODUODENOSCOPY (EGD) (N/A)  Patient Location: Endoscopy Unit  Anesthesia Type:MAC  Level of Consciousness: awake, alert  and oriented  Airway and Oxygen Therapy: Patient Spontanous Breathing  Post-op Pain: none  Post-op Assessment: Post-op Vital signs reviewed, Patient's Cardiovascular Status Stable, Respiratory Function Stable, Patent Airway and Pain level controlled  Post-op Vital Signs: stable  Last Vitals:  Filed Vitals:   01/22/14 1430  BP: 98/36  Pulse: 60  Temp:   Resp: 18    Complications: No apparent anesthesia complications

## 2014-01-22 NOTE — Anesthesia Preprocedure Evaluation (Signed)
Anesthesia Evaluation  Patient identified by MRN, date of birth, ID band Patient awake    Reviewed: Allergy & Precautions, H&P , Patient's Chart, lab work & pertinent test results  Airway Mallampati: I TM Distance: >3 FB Neck ROM: Full    Dental  (+) Teeth Intact, Dental Advisory Given   Pulmonary Current Smoker,  breath sounds clear to auscultation        Cardiovascular Rhythm:Regular Rate:Normal     Neuro/Psych    GI/Hepatic   Endo/Other    Renal/GU      Musculoskeletal   Abdominal   Peds  Hematology   Anesthesia Other Findings   Reproductive/Obstetrics                           Anesthesia Physical Anesthesia Plan  ASA: II  Anesthesia Plan: MAC   Post-op Pain Management:    Induction: Intravenous  Airway Management Planned: Natural Airway and Simple Face Mask  Additional Equipment:   Intra-op Plan:   Post-operative Plan:   Informed Consent: I have reviewed the patients History and Physical, chart, labs and discussed the procedure including the risks, benefits and alternatives for the proposed anesthesia with the patient or authorized representative who has indicated his/her understanding and acceptance.   Dental advisory given  Plan Discussed with: CRNA and Anesthesiologist  Anesthesia Plan Comments:         Anesthesia Quick Evaluation

## 2014-01-22 NOTE — Progress Notes (Signed)
     [facility] Progress Note  Subjective:  Rec 1 unit blood last pm and second unit transfusing. For EGD later today. Has no abd pain. No n/v. Has not had any further bloody BMs. Rec notes from [facility]--pt had exploratory laparoscopy and appendectomy on 03/25/13. Post op dx was mesenteric adenitis.   Objective:  Vital signs in last 24 hours: Temp:  [98.1 F (36.7 C)-98.6 F (37 C)] 98.5 F (36.9 C) (09/23 0758) Pulse Rate:  [55-72] 66 (09/23 0758) Resp:  [9-30] 14 (09/23 0758) BP: (93-122)/(36-53) 112/53 mmHg (09/23 0758) SpO2:  [98 %-100 %] 100 % (09/23 0758) Last BM Date: 01/21/14 General:   Alert,  Well-developed,    in NAD Heart:  Regular rate and rhythm; no murmurs Pulm lungs clear to ausc bilat Abdomen:  Soft, nontender and nondistended. Normal bowel sounds, without guarding, and without rebound.   Extremities:  Without edema. Neurologic:  Alert and  oriented x4;  grossly normal neurologically. Psych:  Alert and cooperative. Normal mood and affect.  Intake/Output from previous day: 09/22 0701 - 09/23 0700 In: 4389.2 [I.V.:3750; Blood:639.2] Out: -  Intake/Output this shift:    Lab Results:  Recent Labs  01/21/14 1400 01/22/14 0015 01/22/14 0725  WBC 9.1 6.8 5.8  HGB 9.0* 8.6* 9.0*  HCT 26.9* 25.7* 26.6*  PLT 183 150 138*   BMET  Recent Labs  01/21/14 0128 01/21/14 1015  NA 141 142  K 4.3 3.7  CL 105 108  CO2 23 25  GLUCOSE 115* 84  BUN 29* 20  CREATININE 1.01 0.88  CALCIUM 8.0* 7.6*   LFT  Recent Labs  01/21/14 1015  PROT 4.6*  ALBUMIN 2.7*  AST 9  ALT 7  ALKPHOS 31*  BILITOT 0.6    Ct Head Wo Contrast  01/21/2014   CLINICAL DATA:  Several recent falls due to acute blood loss anemia. Patient has stroke the head multiple times with the falls and complains now of right-sided headache over the past 4 days. Initial encounter.  EXAM: CT HEAD WITHOUT CONTRAST  TECHNIQUE: Contiguous axial images were obtained from the  base of the skull through the vertex without intravenous contrast.  COMPARISON:  None.  FINDINGS: Ventricular system normal in size and appearance for age. No mass lesion. No midline shift. No acute hemorrhage or hematoma. No extra-axial fluid collections. No evidence of acute infarction. No focal brain parenchymal abnormalities.  No skull fractures or other focal osseous abnormalities involving the skull. Mucous retention cyst or polyp in the right maxillary sinus. Remaining paranasal sinuses, bilateral mastoid air cells, and bilateral middle ear cavities well-aerated.  IMPRESSION: 1. Normal intracranially. 2. Mild chronic right maxillary sinus disease.   Electronically Signed   By: Evangeline Dakin M.D.   On: 01/21/2014 15:02    ASSESSMENT/PLAN:  Acute GI bleed with hematemesis and secondary anemia. Pt has rec 2 units prbs. For EGD later today.On protonix. Pt hemodynamically stable. Further recommendations pending findings of EGD.     LOS: 1 day   Coraline Talwar, Deloris Ping 01/22/2014, Pager [phone]

## 2014-01-22 NOTE — Progress Notes (Signed)
[facility] TEAM 1 - Stepdown/ICU TEAM Progress Note  Taylor Burke GMW:[id number] DOB: 1985-05-23 DOA: 01/21/2014 PCP: No PCP Per Patient  Admit HPI / Brief Narrative: 28 y.o. M Hx appendectomy (possible polyps?) performed in [place] who started throwing up blood. Patient went to the [facility] but left and went home. When he reached home he had a syncopal episode. He had a bowel movement which was dark. EMS was called and patient's blood pressure was initially found to be in the 44I systolic. Patient's hemoglobin in the ER was around 10. Patient stated he had been having some abdominal discomfort. Patient stated for one week he had been having some headaches for which he had been taking aspirin and ibuprofen. Patient denied any previous history of GI bleed.   HPI/Subjective: Pt is groggy post EGD, but able to communicate.  C/o ongoing HA.  Denies cp, sob, n/v.    Assessment/Plan:  Symptomatic GI bleed  EGD notes multiple linear ulcers in an otherwise very edematous duodenal bulb, none had visible vessels, doudenal mucosa was biopsied, mild, non-specific distal gastritis - suggested tx plan per GI is to "continue PPI but change to oral twice daily - clear liquids - advance as tolerated - strict avoidance of NSAIDs - await final pathology" - discussed plan at length w/ pt and parents in room - transfer to med bed - f/u CBC in AM   Acute blood loss anemia Due to GIB - s/p 2U PRBC - Hgb stable - goal to keep Hgb 7.0 or > - recheck in AM   Multiple falls due to syncope / head trauma  Head CT unrevealing - SHORT TERM narcotic dosing for pain, but would NOT provide outpt narcotics   Headache - acute on chronic  Due to falls/trauma, as well as chronic component - avoid NSAIDs, but would also strongly advise against using chronic narcotics - may need Neuro visit as outpt if sx persist after recovers from this admit   Code Status: FULL Family Communication: spoke to parents at bedside at  length w/ permission from pt  Disposition Plan: transfer to medical bed   Consultants: GI   Antibiotics: NA  DVT prophylaxis: SCD  Objective: Blood pressure 98/36, pulse 60, temperature 97.9 F (36.6 C), temperature source Oral, resp. rate 18, height 6\' 1"  (1.854 m), weight 75.6 kg (166 lb 10.7 oz), SpO2 100.00%.  Intake/Output Summary (Last 24 hours) at 01/22/14 1542 Last data filed at 01/22/14 1500  Gross per 24 hour  Intake 5839.17 ml  Output      0 ml  Net 5839.17 ml   Exam: General: No acute respiratory distress Lungs: Clear to auscultation bilaterally without wheezes or crackles Cardiovascular: Regular rate and rhythm without murmur gallop or rub  Abdomen: Nontender, nondistended, soft, bowel sounds positive, no rebound, no ascites, no appreciable mass Extremities: No significant cyanosis, clubbing, or edema bilateral lower extremities  Data Reviewed: Basic Metabolic Panel:  Recent Labs Lab 01/21/14 0128 01/21/14 1015  NA 141 142  K 4.3 3.7  CL 105 108  CO2 23 25  GLUCOSE 115* 84  BUN 29* 20  CREATININE 1.01 0.88  CALCIUM 8.0* 7.6*   Liver Function Tests:  Recent Labs Lab 01/21/14 0128 01/21/14 1015  AST 12 9  ALT 8 7  ALKPHOS 34* 31*  BILITOT 0.5 0.6  PROT 5.4* 4.6*  ALBUMIN 3.1* 2.7*    Recent Labs Lab 01/21/14 0128  LIPASE 14   CBC:  Recent Labs Lab 01/21/14 0128  01/21/14 1015 01/21/14 1400 01/22/14 0015 01/22/14 0725  WBC 14.5* 7.4 9.1 6.8 5.8  NEUTROABS 12.0*  --   --   --   --   HGB 10.7* 8.2* 9.0* 8.6* 9.0*  HCT 32.0* 24.4* 26.9* 25.7* 26.6*  MCV 88.2 89.4 90.6 89.5 90.2  PLT 171 164 183 150 138*   CBG:  Recent Labs Lab 01/21/14 1236 01/21/14 2114 01/22/14 0327 01/22/14 1241  GLUCAP 80 75 80 95    Recent Results (from the past 240 hour(s))  MRSA PCR SCREENING     Status: None   Collection Time    01/21/14  5:45 AM      Result Value Ref Range Status   MRSA by PCR NEGATIVE  NEGATIVE Final   Comment:             The GeneXpert MRSA Assay (FDA     approved for NASAL specimens     only), is one component of a     comprehensive MRSA colonization     surveillance program. It is not     intended to diagnose MRSA     infection nor to guide or     monitor treatment for     MRSA infections.     Studies:  Recent x-ray studies have been reviewed in detail by the Attending Physician  Scheduled Meds: . pantoprazole  40 mg Oral BID AC    Time spent on care of this patient: 35 mins  Cherene Altes, MD [facility] For Consults/Admissions - Flow Manager - [phone] Office  [phone] Pager [phone]  On-Call/Text Page:      [email or web address]      password [facility]  01/22/2014, 3:42 PM   LOS: 1 day

## 2014-01-22 NOTE — Progress Notes (Signed)
Patient complaining of headache, "10" out of "10" pain. Refusing tylenol prn at this time. Tramadol 50mg  PO prn administered. Will continue to monitor.

## 2014-01-22 NOTE — Interval H&P Note (Signed)
History and Physical Interval Note:  01/22/2014 1:38 PM  Taylor Burke  has presented today for surgery, with the diagnosis of hematemesis, anemia, melena  The various methods of treatment have been discussed with the patient and family. After consideration of risks, benefits and other options for treatment, the patient has consented to  Procedure(s): ESOPHAGOGASTRODUODENOSCOPY (EGD) (N/A) as a surgical intervention .  The patient's history has been reviewed, patient examined, no change in status, stable for surgery.  I have reviewed the patient's chart and labs.  Questions were answered to the patient's satisfaction.     Milus Banister

## 2014-01-22 NOTE — Op Note (Signed)
[facility] [place], [id number]   ENDOSCOPY PROCEDURE REPORT  PATIENT: Taylor, Burke  MR#: [id number] BIRTHDATE: February 09, 1986 , 28  yrs. old GENDER: male ENDOSCOPIST: Milus Banister, MD PROCEDURE DATE:  01/22/2014 PROCEDURE:  EGD w/ biopsy ASA CLASS:     Class II INDICATIONS:  hematemesis, melena, anemia. MEDICATIONS: Monitored anesthesia care TOPICAL ANESTHETIC: none  DESCRIPTION OF PROCEDURE: After the risks benefits and alternatives of the procedure were thoroughly explained, informed consent was obtained.  The Pentax Gastroscope [id number] endoscope was introduced through the mouth and advanced to the second portion of the duodenum , limited by Without limitations. The instrument was slowly withdrawn as the mucosa was fully examined.  There were multiple linear ulcers in an otherwise very edematous duodenal bulb.  None had visible vessels, there was no recent or old blood in the UGI tract.  The doudenal mucosa was biopsied. There was mild, non-specific distal gastritis.  This was biopsied and sent to pathology.  The examination was otherwise normal. Retroflexed views revealed no abnormalities.     The scope was then withdrawn from the patient and the procedure completed. COMPLICATIONS: There were no complications.  ENDOSCOPIC IMPRESSION: There were multiple linear ulcers in an otherwise very edematous duodenal bulb.  None had visible vessels, there was no recent or old blood in the UGI tract.  The doudenal mucosa was biopsied. There was mild, non-specific distal gastritis.  This was biopsied and sent to pathology.  The examination was otherwise normal  RECOMMENDATIONS: Continue PPI but will change to oral twice daily. Clear liquids, advance as tolerated. Strict avoidance of NSAIDs. Await final pathology.   eSigned:  Milus Banister, MD 01/22/2014 2:24 PM

## 2014-01-22 NOTE — H&P (View-Only) (Signed)
Referring Provider: [facility] Primary Care Physician:  No PCP Per Patient Primary Gastroenterologist:  unassigned  Reason for Consultation:  Anemia, hematemesis   HPI: Taylor Burke is a 28 y.o. male admitted through the ER due to hematemesis, anemia, and a syncopal episode. Pt reports a 9 month hx of intermittent abdominal pain. He says he threw up blood yesterday around 9 pm. He went to [facility] ER but left withoutbeing seen.  He went home and had a syncopal episode. He then had a BM that was dark and then bloody.  EMS was called and pt was found to have a systolic BP of 50. Pt says for the past week he has been having headaches and has used aspirin and ibuprofen.Pts Hgb is 8.2.      Pt says that last Oct or Nov he had RLQ pain and was seen at [facility] in [place], [place]. He says he had his appendix out, and "something else near it was messed up and they took it out". His significant other thinks it was the iliocecal valve. He says he has had RLQ pain since. He was seen in [place] in Feb 2016 and had a CT scan: 1. There is increased density in the fat in the left paracolic gutter anterior to the descending colon without abnormality descending colon or small bowel. This is not free fluid and is not a mass with mass effect. Suggest short-term 3 month followup  exam.     Past Medical History  Diagnosis Date  . Medical history non-contributory     Past Surgical History  Procedure Laterality Date  . Appendectomy      Prior to Admission medications   Medication Sig Start Date End Date Taking? Authorizing Provider  acetaminophen (TYLENOL) 325 MG tablet Take 650 mg by mouth every 6 (six) hours as needed for moderate pain.   Yes Historical Provider, MD  aspirin 325 MG tablet Take 975 mg by mouth every 6 (six) hours as needed for moderate pain.   Yes Historical Provider, MD  ibuprofen (ADVIL,MOTRIN) 200 MG tablet Take 400 mg by mouth every 6 (six) hours as needed for  moderate pain.   Yes Historical Provider, MD    Current Facility-Administered Medications  Medication Dose Route Frequency Provider Last Rate Last Dose  . 0.9 %  sodium chloride infusion   Intravenous Continuous Rise Patience, MD 150 mL/hr at 01/21/14 0900    . acetaminophen (TYLENOL) tablet 650 mg  650 mg Oral Q6H PRN Rise Patience, MD   650 mg at 01/21/14 0919   Or  . acetaminophen (TYLENOL) suppository 650 mg  650 mg Rectal Q6H PRN Rise Patience, MD      . ondansetron [facility]) tablet 4 mg  4 mg Oral Q6H PRN Rise Patience, MD       Or  . ondansetron [facility]) injection 4 mg  4 mg Intravenous Q6H PRN Rise Patience, MD      . pantoprazole (PROTONIX) 80 mg in sodium chloride 0.9 % 250 mL (0.32 mg/mL) infusion  8 mg/hr Intravenous Continuous Delora Fuel, MD 25 mL/hr at 01/21/14 1236 8 mg/hr at 01/21/14 1236    Allergies as of 01/21/2014  . (No Known Allergies)    Family History  Problem Relation Age of Onset  . Colon cancer Neg Hx     History   Social History  . Marital Status: Single    Spouse Name: N/A    Number of Children:  N/A  . Years of Education: N/A   Occupational History  . Not on file.   Social History Main Topics  . Smoking status: Current Every Day Smoker -- 0.50 packs/day    Types: Cigarettes  . Smokeless tobacco: Not on file  . Alcohol Use: No  . Drug Use: No  . Sexual Activity: Yes    Birth Control/ Protection: None   Other Topics Concern  . Not on file   Social History Narrative  . No narrative on file    Review of Systems: Gen: Denies any fever, chills, sweats, anorexia, fatigue, weakness, malaise, weight loss, and sleep disorder CV: Denies chest pain, angina, palpitations, syncope, orthopnea, PND, peripheral edema, and claudication. Resp: Denies dyspnea at rest, dyspnea with exercise, cough, sputum, wheezing,, and pleurisy. GI: Denies, jaundice, and fecal incontinence.   Denies dysphagia or odynophagia.Pt has vomited  blood. GU : Denies urinary burning, blood in urine, urinary frequency, urinary hesitancy, nocturnal urination, and urinary incontinence. MS: Denies joint pain, limitation of movement, and swelling, stiffness, low back pain, extremity pain. Denies muscle weakness, cramps, atrophy.  Derm: Denies rash, itching, dry skin, hives, moles, warts, or unhealing ulcers.  Psych: Denies depression, anxiety, memory loss, suicidal ideation, hallucinations, paranoia, and confusion. Heme: Denies bruising, bleeding, and enlarged lymph nodes. Neuro:  Denies any headaches, dizziness, paresthesias. Endo:  Denies any problems with DM, thyroid, adrenal function.  Physical Exam: Vital signs in last 24 hours: Temp:  [98.3 F (36.8 C)-98.6 F (37 C)] 98.5 F (36.9 C) (09/22 1200) Pulse Rate:  [58-126] 67 (09/22 1200) Resp:  [9-36] 16 (09/22 1200) BP: (82-140)/(25-97) 96/47 mmHg (09/22 1200) SpO2:  [98 %-100 %] 100 % (09/22 1200) Weight:  [166 lb 10.7 oz (75.6 kg)-176 lb (79.833 kg)] 166 lb 10.7 oz (75.6 kg) (09/22 0545) Last BM Date: 01/21/14 General:   Alert,  Well-developed, well-nourished, pleasant and cooperative in NAD Head:  Normocephalic and atraumatic. Eyes:  Sclera clear, no icterus.   Conjunctiva pink. Ears:  Normal auditory acuity. Nose:  No deformity, discharge,  or lesions. Mouth:  No deformity or lesions.   Neck:  Supple; no masses or thyromegaly. Lungs:  Clear throughout to auscultation.   No wheezes, crackles, or rhonchi. Heart:  Regular rate and rhythm; no murmurs, clicks, rubs,  or gallops. Abdomen:  Soft,nontender, BS active,nonpalp mass or hsm.   Rectal:  Deferred  Msk:  Symmetrical without gross deformities. . Pulses:  Normal pulses noted. Extremities:  Without clubbing or edema. Neurologic:  Alert and  oriented x4;  grossly normal neurologically. Skin:  Intact without significant lesions or rashes.. Psych:  Alert and cooperative. Normal mood and affect.  Intake/Output from previous  day: 09/21 0701 - 09/22 0700 In: 2675 [I.V.:1675; IV Piggyback:1000] Out: 120 [Urine:120] Intake/Output this shift:    Lab Results:  Recent Labs  01/21/14 0128 01/21/14 1015  WBC 14.5* 7.4  HGB 10.7* 8.2*  HCT 32.0* 24.4*  PLT 171 164  CBC fron 06/17/13 Hgb 14 HCT 42.5   BMET  Recent Labs  01/21/14 0128 01/21/14 1015  NA 141 142  K 4.3 3.7  CL 105 108  CO2 23 25  GLUCOSE 115* 84  BUN 29* 20  CREATININE 1.01 0.88  CALCIUM 8.0* 7.6*   LFT  Recent Labs  01/21/14 1015  PROT 4.6*  ALBUMIN 2.7*  AST 9  ALT 7  ALKPHOS 31*  BILITOT 0.6     Studies/Results: CT abd from [facility] on2/16/15: IMPRESSION: 1. There is increased density  in the fat in the left paracolic gutter anterior to the descending colon without abnormality descending colon or small bowel. This is not free fluid and is not a mass with mass effect. Suggest short-term 3 month followup  exam. 2. Otherwise negative exam     IMPRESSION/PLAN: 1. Acute GI bleed with hematemesis and subsequent dark stool. Likely UGI bleed, ? If due to NSAIDS causing gastritis or ulcers, ? Mallory-Weiss tear? Continue IVF. Monitor H/H. Pt to be scheduled for EGD tomorrow morning. Continue protonix infusion. Will have pt sign med release to try to get records from [facility] regarding previous surgery.Orders for additional 1000cc bolus discontinued as it would likely result in further dilutional anemia. Pt currently stable. 2.Syncope. Likely due toUGI bleed.      Hvozdovic, Deloris Ping 01/21/2014,  Pager [phone]   ________________________________________________________________________  Velora Heckler GI MD note:  I personally examined the patient, reviewed the data and agree with the assessment and plan described above.  HAs had melena this afternoon twice.  HD stable. Was put in for transfer to step down but I think he should stay in ICU for now.  I've ordered two units blood transfusion to be given.  Planning on EGD  tomorrow, earlier if needed but he's been HD stable since initial rescusitation and so I think he is unlikely to be actively bleeding.   Owens Loffler, MD [facility] Pager [phone]

## 2014-01-22 NOTE — Progress Notes (Signed)
Patient transferred to unit via stretcher to [phone]. Pt alert and oriented x 4 and in no distress, family at bedside. Skin intact, IV intact. Call bell within reach, pt understands how to get into contact with nursing staff. Will continue to monitor patient per MD orders.

## 2014-01-22 NOTE — Transfer of Care (Signed)
Immediate Anesthesia Transfer of Care Note  Patient: Taylor Burke  Procedure(s) Performed: Procedure(s): ESOPHAGOGASTRODUODENOSCOPY (EGD) (N/A)  Patient Location: Endoscopy Unit  Anesthesia Type:MAC  Level of Consciousness: awake  Airway & Oxygen Therapy: Patient Spontanous Breathing and Patient connected to face mask oxygen  Post-op Assessment: Report given to PACU RN  Post vital signs: Reviewed and stable  Complications: No apparent anesthesia complications

## 2014-01-23 ENCOUNTER — Encounter (HOSPITAL_COMMUNITY): Payer: Self-pay | Admitting: Gastroenterology

## 2014-01-23 DIAGNOSIS — K2981 Duodenitis with bleeding: Principal | ICD-10-CM

## 2014-01-23 LAB — CBC
HEMATOCRIT: 27 % — AB (ref 39.0–52.0)
Hemoglobin: 9.2 g/dL — ABNORMAL LOW (ref 13.0–17.0)
MCH: 29.8 pg (ref 26.0–34.0)
MCHC: 34.1 g/dL (ref 30.0–36.0)
MCV: 87.4 fL (ref 78.0–100.0)
PLATELETS: 163 10*3/uL (ref 150–400)
RBC: 3.09 MIL/uL — AB (ref 4.22–5.81)
RDW: 12.9 % (ref 11.5–15.5)
WBC: 5.7 10*3/uL (ref 4.0–10.5)

## 2014-01-23 LAB — HIV ANTIBODY (ROUTINE TESTING W REFLEX): HIV 1&2 Ab, 4th Generation: NONREACTIVE

## 2014-01-23 LAB — C-REACTIVE PROTEIN: CRP: <0.5 mg/dL — ABNORMAL LOW (ref <0.60)

## 2014-01-23 MED ORDER — LEVOFLOXACIN 250 MG PO TABS
250.0000 mg | ORAL_TABLET | Freq: Every day | ORAL | Status: DC
  Administered 2014-01-23 – 2014-01-24 (×2): 250 mg via ORAL
  Filled 2014-01-23 (×2): qty 1

## 2014-01-23 MED ORDER — PREDNISONE 10 MG PO TABS
10.0000 mg | ORAL_TABLET | Freq: Two times a day (BID) | ORAL | Status: DC
  Administered 2014-01-23 – 2014-01-24 (×2): 10 mg via ORAL
  Filled 2014-01-23 (×4): qty 1

## 2014-01-23 MED ORDER — PNEUMOCOCCAL VAC POLYVALENT 25 MCG/0.5ML IJ INJ
0.5000 mL | INJECTION | INTRAMUSCULAR | Status: DC
  Filled 2014-01-23: qty 0.5

## 2014-01-23 NOTE — Progress Notes (Signed)
[facility] Progress Note     Subjective:   S/P EGD yesterday  That had linear erosions in duodenal bulb. Pt was placed on bid PPI. HgB today 9.2.No abd pain. No vomiting. No dark or bloody BMs. Hungry. Wants to eat. Main complaint is headache.   Objective:  Vital signs in last 24 hours: Temp:  [97.8 F (36.6 C)-98.6 F (37 C)] 98.1 F (36.7 C) (09/24 0552) Pulse Rate:  [59-100] 60 (09/24 0552) Resp:  [18-59] 18 (09/24 0552) BP: (98-137)/(36-69) 114/57 mmHg (09/24 0552) SpO2:  [98 %-100 %] 99 % (09/24 0552) Weight:  [130 lb 8 oz (59.194 kg)] 130 lb 8 oz (59.194 kg) (09/23 2015) Last BM Date: 01/22/14 General:   Alert,  Well-developed, male   in NAD Heart:  Regular rate and rhythm; no murmurs Pulm;lungs clear to ausc bilat Abdomen:  Soft, nontender and nondistended. Normal bowel sounds, without guarding, and without rebound.   Extremities:  Without edema. Neurologic:  Alert and  oriented x4;  grossly normal neurologically. Psych:  Alert and cooperative. Normal mood and affect.  Intake/Output from previous day: 09/23 0701 - 09/24 0700 In: 1543.3 [I.V.:1543.3] Out: -  Intake/Output this shift:    Lab Results:  Recent Labs  01/22/14 0015 01/22/14 0725 01/23/14 0751  WBC 6.8 5.8 5.7  HGB 8.6* 9.0* 9.2*  HCT 25.7* 26.6* 27.0*  PLT 150 138* 163   BMET  Recent Labs  01/21/14 0128 01/21/14 1015  NA 141 142  K 4.3 3.7  CL 105 108  CO2 23 25  GLUCOSE 115* 84  BUN 29* 20  CREATININE 1.01 0.88  CALCIUM 8.0* 7.6*   LFT  Recent Labs  01/21/14 1015  PROT 4.6*  ALBUMIN 2.7*  AST 9  ALT 7  ALKPHOS 31*  BILITOT 0.6   Ct Head Wo Contrast  01/21/2014   CLINICAL DATA:  Several recent falls due to acute blood loss anemia. Patient has stroke the head multiple times with the falls and complains now of right-sided headache over the past 4 days. Initial encounter.  EXAM: CT HEAD WITHOUT CONTRAST  TECHNIQUE: Contiguous axial images were obtained from  the base of the skull through the vertex without intravenous contrast.  COMPARISON:  None.  FINDINGS: Ventricular system normal in size and appearance for age. No mass lesion. No midline shift. No acute hemorrhage or hematoma. No extra-axial fluid collections. No evidence of acute infarction. No focal brain parenchymal abnormalities.  No skull fractures or other focal osseous abnormalities involving the skull. Mucous retention cyst or polyp in the right maxillary sinus. Remaining paranasal sinuses, bilateral mastoid air cells, and bilateral middle ear cavities well-aerated.  IMPRESSION: 1. Normal intracranially. 2. Mild chronic right maxillary sinus disease.   Electronically Signed   By: Evangeline Dakin M.D.   On: 01/21/2014 15:02   PROCEDURES: EGD 01/22/14: ENDOSCOPIC IMPRESSION: There were multiple linear ulcers in an otherwise very edematous duodenal bulb. None had visible vessels, there was no recent or old blood in the UGI tract. The doudenal mucosa was biopsied. There was mild, non-specific distal gastritis. This was biopsied and sent to pathology. The examination was otherwise normal RECOMMENDATIONS: Continue PPI but will change to oral twice daily. Clear liquids, advance as tolerated. Strict avoidance of NSAIDs.   ASSESSMENT/PLAN:   1.Acute GI bleed with hematemesis and secondary anemia. EGD had linear erosions in duodeneum. Pt to be on bid PPI x 6 weeks then decrease to once daily. Will advance diet. 2. Headache--management to be  per medical team.     LOS: 2 days   Hvozdovic, Vita Barley PA-C 01/23/2014, Pager [phone]  ________________________________________________________________________  Velora Heckler GI MD note:  I personally examined the patient, reviewed the data and agree with the assessment and plan described above.  Biopsies from EGD yesterday " the overall gastric duodenal findings are highly suspicious for upper gastrointestinal involvement by idiopathic inflammatory bowel  disease (Crohn's)."  The duodenal ulcers were quite unusual appearing, linear, serpiginous and clinically I was suspicious of IBD.  Pathology supports that.  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  I think he truly does have Crohn's disease.  Will merit further workup with labs, colonoscopy, perhaps SB capsule as well.  Much of this will done as outpatient.  I am going to start him on prednisone 10mg  twice daily for now.  Should continue PPI twice daily. Should avoid NSAIDs as best that is possible.   He has right sided headaches and chronic sinusitis on head CT.   Owens Loffler, MD [facility] Pager [phone]

## 2014-01-23 NOTE — Progress Notes (Signed)
Progress Note  Everette Mall FTD:[id number] DOB: 1985-12-26 DOA: 01/21/2014 PCP: No PCP Per Patient  Admit HPI / Brief Narrative: 28 y.o. M Hx appendectomy (possible polyps?) performed in [place] who started throwing up blood. Patient went to the [place] but left and went home. When he reached home he had a syncopal episode. He had a bowel movement which was dark. EMS was called and patient's blood pressure was initially found to be in the 06C systolic. Patient's hemoglobin in the ER was around 10. Patient stated he had been having some abdominal discomfort. Patient stated for one week he had been having some headaches for which he had been taking aspirin and ibuprofen. Patient denied any previous history of GI bleed.   HPI/Subjective: Pt is groggy post EGD, but able to communicate.  C/o ongoing HA.  Denies cp, sob, n/v.    Assessment/Plan:  Symptomatic GI bleed  EGD notes multiple linear ulcers in an otherwise very edematous duodenal bulb, none had visible vessels, doudenal mucosa was biopsied, mild, non-specific distal gastritis - suggested tx plan per GI is to "continue PPI but change to oral twice daily - clear liquids - advance as tolerated - strict avoidance of NSAIDs - await final pathology" - discussed plan at length w/ pt and parents in room - transfer to med bed - f/u CBC in AM   Acute blood loss anemia Due to GIB - s/p 2U PRBC - Hgb stable - goal to keep Hgb 7.0 or > - recheck in AM   Multiple falls due to syncope / head trauma  Head CT unrevealing - SHORT TERM narcotic dosing for pain, but would NOT provide outpt narcotics .   Headache - acute on chronic  Due to falls/trauma, as well as chronic component - avoid NSAIDs, but would also strongly advise against using chronic narcotics - may need Neuro visit as outpt if sx persist after recovers from this admit   Chronic maxillary sinusitis: Started him on on levaquin . He will need outpatient ENT evaluation.   ?  Crohns disease: - started on prednisone by GI.   Code Status: FULL Family Communication: none at bedside.  Disposition Plan: pending.   Consultants: GI   Antibiotics: NA  DVT prophylaxis: SCD  Objective: Blood pressure 146/60, pulse 86, temperature 96.5 F (35.8 C), temperature source Oral, resp. rate 20, height 6\' 1"  (1.854 m), weight 59.194 kg (130 lb 8 oz), SpO2 99.00%.  Intake/Output Summary (Last 24 hours) at 01/23/14 1806 Last data filed at 01/23/14 0900  Gross per 24 hour  Intake    240 ml  Output      0 ml  Net    240 ml   Exam: General: No acute respiratory distress Lungs: Clear to auscultation bilaterally without wheezes or crackles Cardiovascular: Regular rate and rhythm without murmur gallop or rub  Abdomen: Nontender, nondistended, soft, bowel sounds positive, no rebound, no ascites, no appreciable mass Extremities: No significant cyanosis, clubbing, or edema bilateral lower extremities  Data Reviewed: Basic Metabolic Panel:  Recent Labs Lab 01/21/14 0128 01/21/14 1015  NA 141 142  K 4.3 3.7  CL 105 108  CO2 23 25  GLUCOSE 115* 84  BUN 29* 20  CREATININE 1.01 0.88  CALCIUM 8.0* 7.6*   Liver Function Tests:  Recent Labs Lab 01/21/14 0128 01/21/14 1015  AST 12 9  ALT 8 7  ALKPHOS 34* 31*  BILITOT 0.5 0.6  PROT 5.4* 4.6*  ALBUMIN 3.1* 2.7*  Recent Labs Lab 01/21/14 0128  LIPASE 14   CBC:  Recent Labs Lab 01/21/14 0128 01/21/14 1015 01/21/14 1400 01/22/14 0015 01/22/14 0725 01/23/14 0751  WBC 14.5* 7.4 9.1 6.8 5.8 5.7  NEUTROABS 12.0*  --   --   --   --   --   HGB 10.7* 8.2* 9.0* 8.6* 9.0* 9.2*  HCT 32.0* 24.4* 26.9* 25.7* 26.6* 27.0*  MCV 88.2 89.4 90.6 89.5 90.2 87.4  PLT 171 164 183 150 138* 163   CBG:  Recent Labs Lab 01/21/14 1236 01/21/14 2114 01/22/14 0327 01/22/14 1241  GLUCAP 80 75 80 95    Recent Results (from the past 240 hour(s))  MRSA PCR SCREENING     Status: None   Collection Time    01/21/14   5:45 AM      Result Value Ref Range Status   MRSA by PCR NEGATIVE  NEGATIVE Final   Comment:            The GeneXpert MRSA Assay (FDA     approved for NASAL specimens     only), is one component of a     comprehensive MRSA colonization     surveillance program. It is not     intended to diagnose MRSA     infection nor to guide or     monitor treatment for     MRSA infections.     Studies:  Recent x-ray studies have been reviewed in detail by the Attending Physician  Scheduled Meds: . pantoprazole  40 mg Oral BID AC  . [START ON 01/24/2014] pneumococcal 23 valent vaccine  0.5 mL Intramuscular Tomorrow-1000  . predniSONE  10 mg Oral BID WC    Time spent on care of this patient: 25 mins  Hosie Poisson, MD [facility] For Consults/Admissions - [place] - [phone] Office  [phone] Pager [phone]  On-Call/Text Page:      [email or web address]      password [facility]  01/23/2014, 6:06 PM   LOS: 2 days

## 2014-01-23 NOTE — Clinical Documentation Improvement (Signed)
Possible Clinical Conditions?  Severe Malnutrition   Protein Calorie Malnutrition Severe Protein Calorie Malnutrition Other Condition Cannot clinically determine  Supporting Information: Risk Factors: BMI 17.22   Thank You, Alessandra Grout, RN, BSN, CCDS,Clinical Documentation Specialist:  [phone]  [phone]=[facility] [employer]- Health Information Management

## 2014-01-23 NOTE — Care Management Note (Signed)
    Page 1 of 1   01/23/2014     2:27:59 PM CARE MANAGEMENT NOTE 01/23/2014  Patient:  Taylor Burke, Taylor Burke   Account Number:  [id number]  Date Initiated:  01/21/2014  Documentation initiated by:  Elissa Hefty  Subjective/Objective Assessment:   adm w gi bleed     Action/Plan:   lives w fam   Anticipated DC Date:  01/24/2014   Anticipated DC Plan:  [place]  CM consult      Choice offered to / List presented to:             Status of service:  In process, will continue to follow Medicare Important Message given?  NO (If response is "NO", the following Medicare IM given date fields will be blank) Date Medicare IM given:   Medicare IM given by:   Date Additional Medicare IM given:   Additional Medicare IM given by:    Discharge Disposition:    Per UR Regulation:  Reviewed for med. necessity/level of care/duration of stay  If discussed at [place] of Stay Meetings, dates discussed:    Comments:

## 2014-01-24 ENCOUNTER — Encounter: Payer: Self-pay | Admitting: Nurse Practitioner

## 2014-01-24 DIAGNOSIS — R55 Syncope and collapse: Secondary | ICD-10-CM

## 2014-01-24 LAB — CBC
HCT: 30.5 % — ABNORMAL LOW (ref 39.0–52.0)
Hemoglobin: 10.2 g/dL — ABNORMAL LOW (ref 13.0–17.0)
MCH: 29.2 pg (ref 26.0–34.0)
MCHC: 33.4 g/dL (ref 30.0–36.0)
MCV: 87.4 fL (ref 78.0–100.0)
PLATELETS: 213 10*3/uL (ref 150–400)
RBC: 3.49 MIL/uL — ABNORMAL LOW (ref 4.22–5.81)
RDW: 13 % (ref 11.5–15.5)
WBC: 5.8 10*3/uL (ref 4.0–10.5)

## 2014-01-24 LAB — ANGIOTENSIN CONVERTING ENZYME: Angiotensin-Converting Enzyme: 20 U/L (ref 8–52)

## 2014-01-24 LAB — HEPATITIS B SURFACE ANTIBODY,QUALITATIVE: Hep B S Ab: NEGATIVE

## 2014-01-24 LAB — HEPATITIS B SURFACE ANTIGEN: Hepatitis B Surface Ag: NEGATIVE

## 2014-01-24 MED ORDER — PREDNISONE 10 MG PO TABS
10.0000 mg | ORAL_TABLET | Freq: Two times a day (BID) | ORAL | Status: DC
Start: 2014-01-24 — End: 2014-02-24

## 2014-01-24 NOTE — Progress Notes (Signed)
Pt. C/o of chest pain on the left side of his chest after taking prednisone & protonix this am.  Also said he had this same pain yesterday after taking these medications.  Rates his pain 3 on scale of 0-10, and describes it as tightness.  VSS - Blood pressure 122/76, pulse 60, temperature 98.3 F (36.8 C), temperature source Oral, resp. rate 18, height 6\' 1"  (1.854 m), weight 59.194 kg (130 lb 8 oz), SpO2 98.00%., R/A.  Text paged Dr. Wynelle Cleveland and informed of above.  Will continue to monitor and await for return call or new orders.  Alphonzo Lemmings, RN

## 2014-01-24 NOTE — Progress Notes (Signed)
Daily Rounding Note  01/24/2014, 8:51 AM  LOS: 3 days   SUBJECTIVE:       No abdominal pain, no nausea.  Ready to discharge home.  Tolerating solids.  No BMs since Monday.  Normally has BM every 3 to 4 days.   OBJECTIVE:         Vital signs in last 24 hours:    Temp:  [96.5 F (35.8 C)-98.3 F (36.8 C)] 98.3 F (36.8 C) (09/25 0558) Pulse Rate:  [58-96] 60 (09/25 0816) Resp:  [17-20] 18 (09/25 0816) BP: (112-146)/(57-76) 122/76 mmHg (09/25 0816) SpO2:  [95 %-99 %] 98 % (09/25 0816) Last BM Date: 01/22/14 Filed Weights   01/21/14 0126 01/21/14 0545 01/22/14 2015  Weight: 79.833 kg (176 lb) 75.6 kg (166 lb 10.7 oz) 59.194 kg (130 lb 8 oz)   General: looks well.  comfortable   Heart: RRR Chest: clear bil. Abdomen: soft, ND, NT.  Active BS.  Extremities: no CCE Neuro/Psych:  Pleasant, oriented x 3.  Affect subdued.   Intake/Output from previous day: 09/24 0701 - 09/25 0700 In: 240 [P.O.:240] Out: -   Intake/Output this shift:    Lab Results:  Recent Labs  01/22/14 0015 01/22/14 0725 01/23/14 0751  WBC 6.8 5.8 5.7  HGB 8.6* 9.0* 9.2*  HCT 25.7* 26.6* 27.0*  PLT 150 138* 163   BMET  Recent Labs  01/21/14 1015  NA 142  K 3.7  CL 108  CO2 25  GLUCOSE 84  BUN 20  CREATININE 0.88  CALCIUM 7.6*   LFT  Recent Labs  01/21/14 1015  PROT 4.6*  ALBUMIN 2.7*  AST 9  ALT 7  ALKPHOS 31*  BILITOT 0.6   PT/INR No results found for this basename: LABPROT, INR,  in the last 72 hours Hepatitis Panel  Recent Labs  01/23/14 1250  HEPBSAG NEGATIVE   Scheduled Meds: . levofloxacin  250 mg Oral Daily  . pantoprazole  40 mg Oral BID AC  . pneumococcal 23 valent vaccine  0.5 mL Intramuscular Tomorrow-1000  . predniSONE  10 mg Oral BID WC   Continuous Infusions:  PRN Meds:.acetaminophen, acetaminophen, ondansetron (ZOFRAN) IV, ondansetron, traMADol   ASSESMENT:   *  GI bleed with  hematemesis.  EGD 9/23 with linear erosions in duodenum. Biopsy reading suspicious for idiopathic IBD.  Given unusual apppendix biopsy findings 2014 there is strong suspicion for Crohn's disease.  Started on Prednisone 10 mg BID in addition to BID Protonix.   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   *  Headaches.  Sinusitis on CT head:  Led to NSAID use. Po Levaquin day 2.   *  Normocytic anemia.    PLAN   *  GI follow up with Tye Savoy on 02/24/14 at 0830.  Unable to get appt with Dr Ardis Hughs until December: too long a time to go without follow up in pt that will be taking prednisone.  Can address initiation of hepatitis vaccination, colonoscopy, SB capsule study then *  Protonix 40 mg BID, Prednisone 10 mg BID at discharge.  *  Will sign off *  Pt advised as to chronic nature of the presumed dx of Crohn's dz and advised to stay on all Rxd mes.    Taylor Burke  01/24/2014, 8:51 AM Pager: [phone]  ________________________________________________________________________  Velora Heckler GI MD note:  I personally examined the patient, reviewed the data and agree with the assessment and plan  described above.  OK to go home today on PPI twice daily, nsaid avoidance, pred 10 twice daily.  Interesting that his crp was normal (argues against IBD).   Follow up already planned for October. At that point he will need colonoscopy with look in terminal ileum.    Owens Loffler, MD [facility] Pager [phone]

## 2014-01-24 NOTE — Progress Notes (Signed)
Taylor Burke discharged Home per MD order.  Discharge instructions reviewed and discussed with the patient, all questions and concerns answered. Copy of instructions, care notes for new diagnosis, medications and scripts given to patient.    Medication List    STOP taking these medications       aspirin 325 MG tablet     ibuprofen 200 MG tablet  Commonly known as:  ADVIL,MOTRIN      TAKE these medications       acetaminophen 325 MG tablet  Commonly known as:  TYLENOL  Take 650 mg by mouth every 6 (six) hours as needed for moderate pain.     predniSONE 10 MG tablet  Commonly known as:  DELTASONE  Take 1 tablet (10 mg total) by mouth 2 (two) times daily with a meal.        Patients skin is clean, dry and intact, no evidence of skin break down. IV site discontinued and catheter remains intact. Site without signs and symptoms of complications. Dressing and pressure applied.  Patient ambulated to car independently,  no distress noted upon discharge.  Wynetta Emery, Kendall Justo C 01/24/2014 3:40 PM

## 2014-01-24 NOTE — Discharge Summary (Signed)
Physician Discharge Summary  Taylor Burke YDX:[id number] DOB: [birth year] DOA: 01/21/2014  PCP: No PCP Per Patient  Admit date: 01/21/2014 Discharge date: 01/24/2014  Time spent: >45 minutes  Recommendations for Outpatient Follow-up:  1. F/u with GI  Discharge Condition: stable Diet recommendation: bland diet  Discharge Diagnoses:  Principal Problem:   Acute GI bleeding--Multiple duodenal ulcers  Active Problems: Syncope   Acute blood loss anemia    History of present illness:  28 y.o. M Hx appendectomy (possible polyps?) performed in [place] who started throwing up blood. Patient went to the [facility] but left and went home. When he reached home he had a syncopal episode. He had a bowel movement which was dark. EMS was called and patient's blood pressure was initially found to be in the 72C systolic. Patient's hemoglobin in the ER was around 10. Patient stated he had been having some abdominal discomfort. Patient stated for one week he had been having some headaches for which he had been taking aspirin and ibuprofen He denied any previous history of GI bleed.    Hospital Course:  Symptomatic GI bleed  EGD notes multiple linear ulcers in an otherwise very edematous duodenal bulb, none had visible vessels, doudenal mucosa was biopsied, mild, non-specific distal gastritis  - suggested tx plan is to continue BID PPI - IBD being ruled out (see path report below) and recommended by GI to continue on Prednisone BID 10mg  and f/u as outpt - strict avoidance of NSAIDs discussed in detail with patient-  - discussed plan and the need to follow up with GI at length w/ pt   Acute blood loss anemia  Due to GIB - s/p 2U PRBC on 9/22 - Hgb stable at 9-10 range   Multiple falls due to syncope / head trauma  Head CT unrevealing   Headache - acute on chronic  Due to falls/trauma- has resolved  Chronic maxillary sinusitis:   He will need outpatient ENT evaluation.     Procedures: EGD: RECOMMENDATIONS:  Continue PPI but will change to oral twice daily. Clear liquids,  advance as tolerated.  Strict avoidance of NSAIDs.   Surgical Pathology  1. 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 - WARTHIN-STARRY STAIN NEGATIVE FOR H. PYLORI. 2. Duodenum, Biopsy, Multiple ulcers - DUODENITIS WITH VARIABLE VILLOUS BLUNTING, SEE COMMENT. - NEGATIVE FOR DYSPLASIA OR MALIGNANCY.   Consultations:  GI   Discharge Exam: Filed Weights   01/21/14 0126 01/21/14 0545 01/22/14 2015  Weight: 79.833 kg (176 lb) 75.6 kg (166 lb 10.7 oz) 59.194 kg (130 lb 8 oz)   Filed Vitals:   01/24/14 1351  BP: 117/73  Pulse: 59  Temp: 98.4 F (36.9 C)  Resp: 16    General: AAO x 3, no distress Cardiovascular: RRR, no murmurs  Respiratory: clear to auscultation bilaterally GI: soft, non-tender, non-distended, bowel sound positive  Discharge Instructions You were cared for by a hospitalist during your hospital stay. If you have any questions about your discharge medications or the care you received while you were in the hospital after you are discharged, you can call the unit and asked to speak with the hospitalist on call if the hospitalist that took care of you is not available. Once you are discharged, your primary care physician will handle any further medical issues. Please note that NO REFILLS for any discharge medications will be authorized once you are discharged, as it is imperative that you return  to your primary care physician (or establish a relationship with a primary care physician if you do not have one) for your aftercare needs so that they can reassess your need for medications and monitor your lab values.      Discharge Instructions   Diet - low sodium heart healthy    Complete by:  As directed   Avoid spicy, oily food and caffeine     Increase activity slowly    Complete  by:  As directed             Medication List    STOP taking these medications       aspirin 325 MG tablet     ibuprofen 200 MG tablet  Commonly known as:  ADVIL,MOTRIN      TAKE these medications       acetaminophen 325 MG tablet  Commonly known as:  TYLENOL  Take 650 mg by mouth every 6 (six) hours as needed for moderate pain.     predniSONE 10 MG tablet  Commonly known as:  DELTASONE  Take 1 tablet (10 mg total) by mouth 2 (two) times daily with a meal.       No Known Allergies Follow-up Information   Follow up with Milus Banister, MD. Schedule an appointment as soon as possible for a visit in 1 week.   Specialty:  Gastroenterology   Contact information:   [address] [place] [address] [phone]        The results of significant diagnostics from this hospitalization (including imaging, microbiology, ancillary and laboratory) are listed below for reference.    Significant Diagnostic Studies: Ct Head Wo Contrast  01/21/2014   CLINICAL DATA:  Several recent falls due to acute blood loss anemia. Patient has stroke the head multiple times with the falls and complains now of right-sided headache over the past 4 days. Initial encounter.  EXAM: CT HEAD WITHOUT CONTRAST  TECHNIQUE: Contiguous axial images were obtained from the base of the skull through the vertex without intravenous contrast.  COMPARISON:  None.  FINDINGS: Ventricular system normal in size and appearance for age. No mass lesion. No midline shift. No acute hemorrhage or hematoma. No extra-axial fluid collections. No evidence of acute infarction. No focal brain parenchymal abnormalities.  No skull fractures or other focal osseous abnormalities involving the skull. Mucous retention cyst or polyp in the right maxillary sinus. Remaining paranasal sinuses, bilateral mastoid air cells, and bilateral middle ear cavities well-aerated.  IMPRESSION: 1. Normal intracranially. 2. Mild chronic right maxillary  sinus disease.   Electronically Signed   By: Evangeline Dakin M.D.   On: 01/21/2014 15:02    Microbiology: Recent Results (from the past 240 hour(s))  MRSA PCR SCREENING     Status: None   Collection Time    01/21/14  5:45 AM      Result Value Ref Range Status   MRSA by PCR NEGATIVE  NEGATIVE Final   Comment:            The GeneXpert MRSA Assay (FDA     approved for NASAL specimens     only), is one component of a     comprehensive MRSA colonization     surveillance program. It is not     intended to diagnose MRSA     infection nor to guide or     monitor treatment for     MRSA infections.     Labs: Basic Metabolic Panel:  Recent Labs  Lab 01/21/14 0128 01/21/14 1015  NA 141 142  K 4.3 3.7  CL 105 108  CO2 23 25  GLUCOSE 115* 84  BUN 29* 20  CREATININE 1.01 0.88  CALCIUM 8.0* 7.6*   Liver Function Tests:  Recent Labs Lab 01/21/14 0128 01/21/14 1015  AST 12 9  ALT 8 7  ALKPHOS 34* 31*  BILITOT 0.5 0.6  PROT 5.4* 4.6*  ALBUMIN 3.1* 2.7*    Recent Labs Lab 01/21/14 0128  LIPASE 14   No results found for this basename: AMMONIA,  in the last 168 hours CBC:  Recent Labs Lab 01/21/14 0128  01/21/14 1400 01/22/14 0015 01/22/14 0725 01/23/14 0751 01/24/14 0927  WBC 14.5*  < > 9.1 6.8 5.8 5.7 5.8  NEUTROABS 12.0*  --   --   --   --   --   --   HGB 10.7*  < > 9.0* 8.6* 9.0* 9.2* 10.2*  HCT 32.0*  < > 26.9* 25.7* 26.6* 27.0* 30.5*  MCV 88.2  < > 90.6 89.5 90.2 87.4 87.4  PLT 171  < > 183 150 138* 163 213  < > = values in this interval not displayed. Cardiac Enzymes: No results found for this basename: CKTOTAL, CKMB, CKMBINDEX, TROPONINI,  in the last 168 hours BNP: BNP (last 3 results) No results found for this basename: PROBNP,  in the last 8760 hours CBG:  Recent Labs Lab 01/21/14 1236 01/21/14 2114 01/22/14 0327 01/22/14 1241  GLUCAP 80 75 80 95       SignedDebbe Odea, MD [facility] 01/24/2014, 2:15 PM

## 2014-01-24 NOTE — Progress Notes (Signed)
Pt. Called back and stated he need a note to return to work.  Text paged Dr. Wynelle Cleveland and informed her of the need for a note.  Dr. Wynelle Cleveland enter a note and this RN faxed it over to pt. Work place.  Alphonzo Lemmings, RN

## 2014-01-25 LAB — QUANTIFERON TB GOLD ASSAY (BLOOD)
Interferon Gamma Release Assay: NEGATIVE
QUANTIFERON NIL VALUE: 0.01 [IU]/mL
TB Ag value: 0.08 IU/mL
TB Antigen Minus Nil Value: 0.07 IU/mL

## 2014-01-28 DIAGNOSIS — S12100A Unspecified displaced fracture of second cervical vertebra, initial encounter for closed fracture: Secondary | ICD-10-CM

## 2014-01-28 DIAGNOSIS — S025XXA Fracture of tooth (traumatic), initial encounter for closed fracture: Secondary | ICD-10-CM

## 2014-01-28 DIAGNOSIS — S022XXA Fracture of nasal bones, initial encounter for closed fracture: Secondary | ICD-10-CM

## 2014-01-28 HISTORY — DX: Fracture of nasal bones, initial encounter for closed fracture: S02.2XXA

## 2014-01-28 HISTORY — DX: Fracture of tooth (traumatic), initial encounter for closed fracture: S02.5XXA

## 2014-01-28 HISTORY — DX: Unspecified displaced fracture of second cervical vertebra, initial encounter for closed fracture: S12.100A

## 2014-02-10 ENCOUNTER — Encounter (HOSPITAL_BASED_OUTPATIENT_CLINIC_OR_DEPARTMENT_OTHER): Payer: Self-pay | Admitting: *Deleted

## 2014-02-11 ENCOUNTER — Encounter (HOSPITAL_BASED_OUTPATIENT_CLINIC_OR_DEPARTMENT_OTHER): Payer: Self-pay | Admitting: *Deleted

## 2014-02-11 ENCOUNTER — Encounter (HOSPITAL_COMMUNITY): Payer: BC Managed Care – PPO | Admitting: Anesthesiology

## 2014-02-11 ENCOUNTER — Ambulatory Visit (HOSPITAL_COMMUNITY): Payer: BC Managed Care – PPO | Admitting: Anesthesiology

## 2014-02-11 ENCOUNTER — Encounter (HOSPITAL_COMMUNITY): Payer: Self-pay | Admitting: Pharmacy Technician

## 2014-02-11 MED ORDER — OXYMETAZOLINE HCL 0.05 % NA SOLN: 2.0000 | NASAL | Status: DC

## 2014-02-11 MED ORDER — LACTATED RINGERS IV SOLN: INTRAVENOUS | Status: DC

## 2014-02-11 MED ORDER — FENTANYL CITRATE 0.05 MG/ML IJ SOLN: 50.0000 ug | INTRAMUSCULAR | Status: DC | PRN

## 2014-02-11 MED ORDER — MIDAZOLAM HCL 2 MG/ML PO SYRP: 12.0000 mg | ORAL_SOLUTION | Freq: Once | ORAL | Status: DC | PRN

## 2014-02-11 MED ORDER — MIDAZOLAM HCL 2 MG/2ML IJ SOLN: 1.0000 mg | INTRAMUSCULAR | Status: DC | PRN
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
## 2014-02-11 NOTE — Pre-Procedure Instructions (Signed)
Dr. Al Corpus notified of pt's C2 fx. Unless the cervical collar can be removed for surgery, he cannot be done at [facility].  Ivin Booty at Dr. Janeice Robinson office notified; she will let Dr. Constance Holster know and get back to [place].

## 2014-02-12 ENCOUNTER — Encounter (HOSPITAL_COMMUNITY)
Admission: RE | Disposition: A | Discharge status: Home / Self Care | Payer: Self-pay | Source: Ambulatory Visit | Attending: Otolaryngology

## 2014-02-12 ENCOUNTER — Ambulatory Visit (HOSPITAL_BASED_OUTPATIENT_CLINIC_OR_DEPARTMENT_OTHER)
Admission: RE | Admit: 2014-02-12 | Discharge: 2014-02-12 | Disposition: A | Discharge status: Home / Self Care | Payer: BC Managed Care – PPO | Source: Ambulatory Visit | Attending: Otolaryngology | Admitting: Otolaryngology

## 2014-02-12 ENCOUNTER — Encounter (HOSPITAL_COMMUNITY): Payer: Self-pay | Admitting: *Deleted

## 2014-02-12 DIAGNOSIS — K920 Hematemesis: Secondary | ICD-10-CM | POA: Diagnosis present

## 2014-02-12 DIAGNOSIS — Z539 Procedure and treatment not carried out, unspecified reason: Secondary | ICD-10-CM | POA: Diagnosis not present

## 2014-02-12 DIAGNOSIS — Z87891 Personal history of nicotine dependence: Secondary | ICD-10-CM | POA: Insufficient documentation

## 2014-02-12 HISTORY — DX: Fracture of tooth (traumatic), initial encounter for closed fracture: S02.5XXA

## 2014-02-12 HISTORY — DX: Unspecified displaced fracture of second cervical vertebra, initial encounter for closed fracture: S12.100A

## 2014-02-12 HISTORY — DX: Personal history of other diseases of the digestive system: Z87.19

## 2014-02-12 HISTORY — DX: Duodenal ulcer, unspecified as acute or chronic, without hemorrhage or perforation: K26.9

## 2014-02-12 HISTORY — DX: Anemia, unspecified: D64.9

## 2014-02-12 HISTORY — DX: Fracture of nasal bones, initial encounter for closed fracture: S02.2XXA

## 2014-02-12 HISTORY — DX: Personal history of other medical treatment: Z92.89

## 2014-02-12 LAB — CBC
HEMATOCRIT: 35.5 % — AB (ref 39.0–52.0)
Hemoglobin: 11.6 g/dL — ABNORMAL LOW (ref 13.0–17.0)
MCH: 28.7 pg (ref 26.0–34.0)
MCHC: 32.7 g/dL (ref 30.0–36.0)
MCV: 87.9 fL (ref 78.0–100.0)
Platelets: 312 10*3/uL (ref 150–400)
RBC: 4.04 MIL/uL — ABNORMAL LOW (ref 4.22–5.81)
RDW: 13.5 % (ref 11.5–15.5)
WBC: 9.9 10*3/uL (ref 4.0–10.5)

## 2014-02-12 SURGERY — CANCELLED PROCEDURE

## 2014-02-12 SURGERY — CLOSED REDUCTION, FRACTURE, NASAL BONE
Anesthesia: General | Site: Nose

## 2014-02-12 MED ORDER — OXYMETAZOLINE HCL 0.05 % NA SOLN
NASAL | Status: AC
  Filled 2014-02-12: qty 15

## 2014-02-12 MED ORDER — FENTANYL CITRATE 0.05 MG/ML IJ SOLN
INTRAMUSCULAR | Status: AC
  Filled 2014-02-12: qty 5

## 2014-02-12 MED ORDER — 0.9 % SODIUM CHLORIDE (POUR BTL) OPTIME
TOPICAL | Status: DC | PRN
  Administered 2014-02-12: 1000 mL

## 2014-02-12 MED ORDER — PROPOFOL 10 MG/ML IV BOLUS
INTRAVENOUS | Status: AC
  Filled 2014-02-12: qty 20

## 2014-02-12 MED ORDER — LACTATED RINGERS IV SOLN
INTRAVENOUS | Status: DC
  Administered 2014-02-12: 50 mL/h via INTRAVENOUS

## 2014-02-12 MED ORDER — MIDAZOLAM HCL 2 MG/2ML IJ SOLN
INTRAMUSCULAR | Status: AC
  Filled 2014-02-12: qty 2

## 2014-02-12 MED ORDER — LIDOCAINE-EPINEPHRINE 1 %-1:100000 IJ SOLN
INTRAMUSCULAR | Status: AC
  Filled 2014-02-12: qty 1

## 2014-02-12 SURGICAL SUPPLY — 28 items
BENZOIN TINCTURE PRP APPL 2/3 (GAUZE/BANDAGES/DRESSINGS) IMPLANT
CANISTER SUCTION 2500CC (MISCELLANEOUS) IMPLANT
CLOSURE WOUND 1/4X4 (GAUZE/BANDAGES/DRESSINGS)
COVER MAYO STAND STRL (DRAPES) IMPLANT
COVER TABLE BACK 60X90 (DRAPES) IMPLANT
DRESSING MEROCEL 8CM (GAUZE/BANDAGES/DRESSINGS) IMPLANT
DRESSING NASAL POPE 10X1.5X2.5 (GAUZE/BANDAGES/DRESSINGS) IMPLANT
DRSG MEROCEL 8CM (GAUZE/BANDAGES/DRESSINGS)
DRSG NASAL POPE 10X1.5X2.5 (GAUZE/BANDAGES/DRESSINGS)
GAUZE SPONGE 4X4 16PLY XRAY LF (GAUZE/BANDAGES/DRESSINGS) IMPLANT
GLOVE ECLIPSE 7.5 STRL STRAW (GLOVE) IMPLANT
GOWN STRL REUS W/ TWL LRG LVL3 (GOWN DISPOSABLE) IMPLANT
GOWN STRL REUS W/ TWL XL LVL3 (GOWN DISPOSABLE) IMPLANT
GOWN STRL REUS W/TWL LRG LVL3 (GOWN DISPOSABLE)
GOWN STRL REUS W/TWL XL LVL3 (GOWN DISPOSABLE)
KIT BASIN OR (CUSTOM PROCEDURE TRAY) IMPLANT
KIT ROOM TURNOVER OR (KITS) IMPLANT
NEEDLE 27GAX1X1/2 (NEEDLE) IMPLANT
NS IRRIG 1000ML POUR BTL (IV SOLUTION) IMPLANT
PAD ARMBOARD 7.5X6 YLW CONV (MISCELLANEOUS) IMPLANT
PATTIES SURGICAL .5 X3 (DISPOSABLE) IMPLANT
SPLINT NASAL THERMO PLAST (MISCELLANEOUS) IMPLANT
STRIP CLOSURE SKIN 1/4X4 (GAUZE/BANDAGES/DRESSINGS) IMPLANT
SYR CONTROL 10ML LL (SYRINGE) IMPLANT
TOWEL OR 17X24 6PK STRL BLUE (TOWEL DISPOSABLE) IMPLANT
TUBE CONNECTING 12'X1/4 (SUCTIONS)
TUBE CONNECTING 12X1/4 (SUCTIONS) IMPLANT
WATER STERILE IRR 1000ML POUR (IV SOLUTION) IMPLANT

## 2014-02-12 NOTE — Interval H&P Note (Signed)
History and Physical Interval Note:  02/12/2014 11:43 AM  Taylor Burke  has presented today for surgery, with the diagnosis of NASAL FRACTURE   The various methods of treatment have been discussed with the patient and family. After consideration of risks, benefits and other options for treatment, the patient has consented to  Procedure(s): CLOSED REDUCTION NASAL FRACTURE (N/A) as a surgical intervention .  The patient's history has been reviewed, patient examined, no change in status, stable for surgery.  I have reviewed the patient's chart and labs.  Questions were answered to the patient's satisfaction.     Remmy Crass

## 2014-02-12 NOTE — Anesthesia Preprocedure Evaluation (Deleted)
Anesthesia Evaluation  Patient identified by MRN, date of birth, ID band Patient awake    Reviewed: Allergy & Precautions, H&P , NPO status , Patient's Chart, lab work & pertinent test results  Airway Mallampati: I      Dental  (+) Chipped,    Pulmonary former smoker,          Cardiovascular     Neuro/Psych    GI/Hepatic   Endo/Other    Renal/GU      Musculoskeletal   Abdominal   Peds  Hematology   Anesthesia Other Findings   Reproductive/Obstetrics                          Anesthesia Physical Anesthesia Plan  ASA: I  Anesthesia Plan: General   Post-op Pain Management:    Induction: Intravenous  Airway Management Planned: Oral ETT  Additional Equipment:   Intra-op Plan:   Post-operative Plan: Extubation in OR  Informed Consent: I have reviewed the patients History and Physical, chart, labs and discussed the procedure including the risks, benefits and alternatives for the proposed anesthesia with the patient or authorized representative who has indicated his/her understanding and acceptance.   Dental advisory given  Plan Discussed with: CRNA and Anesthesiologist  Anesthesia Plan Comments:         Anesthesia Quick Evaluation

## 2014-02-24 ENCOUNTER — Ambulatory Visit (INDEPENDENT_AMBULATORY_CARE_PROVIDER_SITE_OTHER): Payer: BC Managed Care – PPO | Admitting: Physician Assistant

## 2014-02-24 ENCOUNTER — Other Ambulatory Visit: Payer: BC Managed Care – PPO

## 2014-02-24 ENCOUNTER — Encounter: Payer: Self-pay | Admitting: Nurse Practitioner

## 2014-02-24 VITALS — BP 120/68 | HR 64 | Ht 73.0 in | Wt 160.0 lb

## 2014-02-24 DIAGNOSIS — K2980 Duodenitis without bleeding: Secondary | ICD-10-CM

## 2014-02-24 DIAGNOSIS — R1013 Epigastric pain: Secondary | ICD-10-CM

## 2014-02-24 LAB — HEPATITIS B SURFACE ANTIGEN: Hepatitis B Surface Ag: NEGATIVE

## 2014-02-24 LAB — HEPATITIS B SURFACE ANTIBODY,QUALITATIVE: HEP B S AB: NEGATIVE

## 2014-02-24 MED ORDER — OMEPRAZOLE 40 MG PO CPDR: DELAYED_RELEASE_CAPSULE | ORAL | Status: DC

## 2014-02-24 MED ORDER — PANTOPRAZOLE SODIUM 40 MG PO TBEC: 40.0000 mg | DELAYED_RELEASE_TABLET | Freq: Every day | ORAL | Status: DC

## 2014-02-24 MED ORDER — PREDNISONE 10 MG PO TABS: ORAL_TABLET | ORAL | Status: DC
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
## 2014-02-24 NOTE — Patient Instructions (Addendum)
You have been scheduled for a follow up with Arta Bruce, PA-C on 03/31/14 @ 2:15 pm.  Your physician has requested that you go to the basement for the following lab work before leaving today: Hep B surface antibody, Hep B surface antigen, TB quantiferon  Your provider has advised that you be on a prednisone taper. The taper instructions are as follows: Prednisone 15 mg (1.5 tablet) by mouth once daily x 7 days Prednisone 10 mg (1 tablet) by mouth once daily x 7 days Prednisone 5 mg (0.5 tablet) by mouth once daily x 7 days Then, discontinue  We have sent the following medications to your pharmacy for you to pick up at your convenience: pantoprazole 40 mg every morning 30 minutes before breakfast
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
## 2014-02-26 ENCOUNTER — Other Ambulatory Visit: Payer: Self-pay

## 2014-02-26 DIAGNOSIS — K5090 Crohn's disease, unspecified, without complications: Secondary | ICD-10-CM

## 2014-02-26 LAB — QUANTIFERON TB GOLD ASSAY (BLOOD)
Interferon Gamma Release Assay: NEGATIVE
Mitogen value: 0.65 IU/mL
Quantiferon Nil Value: 0.01 IU/mL
Quantiferon Tb Ag Minus Nil Value: 0.01 IU/mL
TB Ag value: 0.02 IU/mL

## 2014-02-27 ENCOUNTER — Ambulatory Visit (INDEPENDENT_AMBULATORY_CARE_PROVIDER_SITE_OTHER): Payer: BC Managed Care – PPO | Admitting: Physician Assistant

## 2014-02-27 DIAGNOSIS — Z23 Encounter for immunization: Secondary | ICD-10-CM

## 2014-02-27 DIAGNOSIS — K5090 Crohn's disease, unspecified, without complications: Secondary | ICD-10-CM

## 2014-03-02 NOTE — Progress Notes (Signed)
i agree with the above note, plan 

## 2014-03-31 ENCOUNTER — Ambulatory Visit (INDEPENDENT_AMBULATORY_CARE_PROVIDER_SITE_OTHER): Payer: BC Managed Care – PPO | Admitting: Physician Assistant

## 2014-03-31 ENCOUNTER — Other Ambulatory Visit (INDEPENDENT_AMBULATORY_CARE_PROVIDER_SITE_OTHER): Payer: BC Managed Care – PPO

## 2014-03-31 ENCOUNTER — Encounter: Payer: Self-pay | Admitting: Physician Assistant

## 2014-03-31 VITALS — BP 100/70 | HR 64 | Ht 73.0 in | Wt 164.8 lb

## 2014-03-31 DIAGNOSIS — K269 Duodenal ulcer, unspecified as acute or chronic, without hemorrhage or perforation: Secondary | ICD-10-CM

## 2014-03-31 DIAGNOSIS — D649 Anemia, unspecified: Secondary | ICD-10-CM

## 2014-03-31 DIAGNOSIS — K5909 Other constipation: Secondary | ICD-10-CM

## 2014-03-31 LAB — CBC WITH DIFFERENTIAL/PLATELET
BASOS PCT: 0.4 % (ref 0.0–3.0)
Basophils Absolute: 0 10*3/uL (ref 0.0–0.1)
EOS PCT: 1.7 % (ref 0.0–5.0)
Eosinophils Absolute: 0.1 10*3/uL (ref 0.0–0.7)
HEMATOCRIT: 38.7 % — AB (ref 39.0–52.0)
Hemoglobin: 12.2 g/dL — ABNORMAL LOW (ref 13.0–17.0)
LYMPHS ABS: 2 10*3/uL (ref 0.7–4.0)
Lymphocytes Relative: 27.9 % (ref 12.0–46.0)
MCHC: 31.6 g/dL (ref 30.0–36.0)
MCV: 85.4 fl (ref 78.0–100.0)
MONO ABS: 0.9 10*3/uL (ref 0.1–1.0)
Monocytes Relative: 13.2 % — ABNORMAL HIGH (ref 3.0–12.0)
Neutro Abs: 4 10*3/uL (ref 1.4–7.7)
Neutrophils Relative %: 56.8 % (ref 43.0–77.0)
Platelets: 254 10*3/uL (ref 150.0–400.0)
RBC: 4.53 Mil/uL (ref 4.22–5.81)
RDW: 14.9 % (ref 11.5–15.5)
WBC: 7.1 10*3/uL (ref 4.0–10.5)
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
## 2014-03-31 NOTE — Patient Instructions (Signed)
Please purchase the following medications over the counter and take as directed: Milk of magnesia 2-3 teaspoons every night as needed for constipation (if you skip 2 days without a bowel movement)  Please continue omeprazole 40 mg daily.  Your physician has requested that you go to the basement for the following lab work before leaving today: CBC  Please follow up with Cecille Rubin on Wednesday, 04/23/14 @ 1:15 pm. At that time, we will send a letter to Dr Rolena Infante to ask that he clear you to have a procedure with [place].

## 2014-04-01 NOTE — Progress Notes (Signed)
I agree with the above note, plan 

## 2014-04-23 ENCOUNTER — Ambulatory Visit: Payer: BC Managed Care – PPO | Admitting: Physician Assistant

## 2014-04-24 ENCOUNTER — Telehealth: Payer: Self-pay | Admitting: *Deleted

## 2014-04-24 NOTE — Telephone Encounter (Signed)
Letter sent.
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
## 2014-05-05 NOTE — Telephone Encounter (Signed)
We have received am ok from Dr Rolena Infante for patient to proceed with colonoscopy at this time.

## 2014-05-06 ENCOUNTER — Ambulatory Visit: Payer: BC Managed Care – PPO | Admitting: Physician Assistant

## 2014-05-07 ENCOUNTER — Telehealth: Payer: Self-pay | Admitting: *Deleted

## 2014-05-07 NOTE — Telephone Encounter (Signed)
The patient's chart has been reviewed by Cecille Rubin Hvozdovic, PA-C and the recommendations are noted below.   Follow-up advised. Schedule patient for next available appointment. Unable to reach patient, will send letter.

## 2014-06-30 ENCOUNTER — Other Ambulatory Visit: Payer: Self-pay | Admitting: Gastroenterology

## 2014-06-30 ENCOUNTER — Encounter: Payer: Self-pay | Admitting: Gastroenterology

## 2014-06-30 ENCOUNTER — Ambulatory Visit (INDEPENDENT_AMBULATORY_CARE_PROVIDER_SITE_OTHER): Payer: BLUE CROSS/BLUE SHIELD | Admitting: Gastroenterology

## 2014-06-30 ENCOUNTER — Other Ambulatory Visit: Payer: BLUE CROSS/BLUE SHIELD

## 2014-06-30 VITALS — BP 100/80 | HR 68 | Ht 73.0 in | Wt 163.8 lb

## 2014-06-30 DIAGNOSIS — K50119 Crohn's disease of large intestine with unspecified complications: Secondary | ICD-10-CM
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
## 2014-06-30 NOTE — Patient Instructions (Signed)
You will have labs checked today in the basement lab.  Please head down after you check out with the front desk  (IBD [facility] testing). If this lab test suggests that you have Crohn's disease then you will probably need further testing. Tylenol is the only safe medicine (OTC, won't cause ulcers).

## 2014-07-03 LAB — PROMETHEUS-MAIL

## 2014-07-22 ENCOUNTER — Telehealth: Payer: Self-pay | Admitting: Gastroenterology

## 2014-07-22 NOTE — Telephone Encounter (Signed)
[facility] testing suggest he DOES have underlying Crohn's disease.  He has felt completely normal lately and so I would like to sit with him to discuss if any further testing needs to be done, assess his symptoms again

## 2014-07-23 NOTE — Telephone Encounter (Signed)
Pt aware and ROV scheduled 

## 2014-08-06 ENCOUNTER — Encounter (HOSPITAL_COMMUNITY): Payer: Self-pay | Admitting: Emergency Medicine

## 2014-08-06 ENCOUNTER — Emergency Department (HOSPITAL_COMMUNITY)
Admission: EM | Admit: 2014-08-06 | Discharge: 2014-08-06 | Disposition: A | Discharge status: Home / Self Care | Payer: BLUE CROSS/BLUE SHIELD | Attending: Emergency Medicine | Admitting: Emergency Medicine

## 2014-08-06 DIAGNOSIS — H578 Other specified disorders of eye and adnexa: Secondary | ICD-10-CM | POA: Diagnosis present

## 2014-08-06 DIAGNOSIS — H109 Unspecified conjunctivitis: Secondary | ICD-10-CM | POA: Diagnosis not present

## 2014-08-06 MED ORDER — FLUORESCEIN SODIUM 1 MG OP STRP
1.0000 | ORAL_STRIP | Freq: Once | OPHTHALMIC | Status: DC
  Filled 2014-08-06: qty 1

## 2014-08-06 MED ORDER — TETRACAINE HCL 0.5 % OP SOLN
2.0000 [drp] | Freq: Once | OPHTHALMIC | Status: AC
  Administered 2014-08-06: 2 [drp] via OPHTHALMIC
  Filled 2014-08-06: qty 2

## 2014-08-06 MED ORDER — POLYMYXIN B-TRIMETHOPRIM 10000-0.1 UNIT/ML-% OP SOLN
1.0000 [drp] | Freq: Four times a day (QID) | OPHTHALMIC | Status: DC
  Administered 2014-08-06: 1 [drp] via OPHTHALMIC
  Filled 2014-08-06: qty 10

## 2014-08-06 MED ORDER — LOTEPREDNOL ETABONATE 0.5 % OP SUSP
1.0000 [drp] | Freq: Three times a day (TID) | OPHTHALMIC | Status: DC
  Administered 2014-08-06: 1 [drp] via OPHTHALMIC
  Filled 2014-08-06: qty 5

## 2014-08-06 NOTE — ED Notes (Signed)
Checked patient eyes patient was 20/20 in both eyes, 20/25 left eye and 20/25 right eye

## 2014-08-06 NOTE — ED Notes (Signed)
Meds requested from pharmacy.

## 2014-08-06 NOTE — ED Notes (Signed)
Patient states bilateral eye redness, "and lots of junk in my eyes".  Patient states "I could hardly get my eyes open today".   Patient denies other symptoms.

## 2014-08-06 NOTE — Discharge Instructions (Signed)
Conjunctivitis Conjunctivitis is commonly called "pink eye." Conjunctivitis can be caused by bacterial or viral infection, allergies, or injuries. There is usually redness of the lining of the eye, itching, discomfort, and sometimes discharge. There may be deposits of matter along the eyelids. A viral infection usually causes a watery discharge, while a bacterial infection causes a yellowish, thick discharge. Pink eye is very contagious and spreads by direct contact. You may be given antibiotic eyedrops as part of your treatment. Before using your eye medicine, remove all drainage from the eye by washing gently with warm water and cotton balls. Continue to use the medication until you have awakened 2 mornings in a row without discharge from the eye. Do not rub your eye. This increases the irritation and helps spread infection. Use separate towels from other household members. Wash your hands with soap and water before and after touching your eyes. Use cold compresses to reduce pain and sunglasses to relieve irritation from light. Do not wear contact lenses or wear eye makeup until the infection is gone. SEEK MEDICAL CARE IF:   Your symptoms are not better after 3 days of treatment.  You have increased pain or trouble seeing.  The outer eyelids become very red or swollen. Document Released: 05/26/2004 Document Revised: 07/11/2011 Document Reviewed: 04/18/2005 [facility] Patient Information 2015 [place], [place]. This information is not intended to replace advice given to you by your health care provider. Make sure you discuss any questions you have with your health care provider.  Allergic Conjunctivitis The conjunctiva is a thin membrane that covers the visible white part of the eyeball and the underside of the eyelids. This membrane protects and lubricates the eye. The membrane has small blood vessels running through it that can normally be seen. When the conjunctiva becomes inflamed, the condition is  called conjunctivitis. In response to the inflammation, the conjunctival blood vessels become swollen. The swelling results in redness in the normally white part of the eye. The blood vessels of this membrane also react when a person has allergies and is then called allergic conjunctivitis. This condition usually lasts for as long as the allergy persists. Allergic conjunctivitis cannot be passed to another person (non-contagious). The likelihood of bacterial infection is great and the cause is not likely due to allergies if the inflamed eye has:  A sticky discharge.  Discharge or sticking together of the lids in the morning.  Scaling or flaking of the eyelids where the eyelashes come out.  Red swollen eyelids. CAUSES   Viruses.  Irritants such as foreign bodies.  Chemicals.  General allergic reactions.  Inflammation or serious diseases in the inside or the outside of the eye or the orbit (the boney cavity in which the eye sits) can cause a "red eye." SYMPTOMS   Eye redness.  Tearing.  Itchy eyes.  Burning feeling in the eyes.  Clear drainage from the eye.  Allergic reaction due to pollens or ragweed sensitivity. Seasonal allergic conjunctivitis is frequent in the spring when pollens are in the air and in the fall. DIAGNOSIS  This condition, in its many forms, is usually diagnosed based on the history and an ophthalmological exam. It usually involves both eyes. If your eyes react at the same time every year, allergies may be the cause. While most "red eyes" are due to allergy or an infection, the role of an eye (ophthalmological) exam is important. The exam can rule out serious diseases of the eye or orbit. TREATMENT   Non-antibiotic eye drops,  ointments, or medications by mouth may be prescribed if the ophthalmologist is sure the conjunctivitis is due to allergies alone.  Over-the-counter drops and ointments for allergic symptoms should be used only after other causes of  conjunctivitis have been ruled out, or as your caregiver suggests. Medications by mouth are often prescribed if other allergy-related symptoms are present. If the ophthalmologist is sure that the conjunctivitis is due to allergies alone, treatment is normally limited to drops or ointments to reduce itching and burning. HOME CARE INSTRUCTIONS   Wash hands before and after applying drops or ointments, or touching the inflamed eye(s) or eyelids.  Do not let the eye dropper tip or ointment tube touch the eyelid when putting medicine in your eye.  Stop using your soft contact lenses and throw them away. Use a new pair of lenses when recovery is complete. You should run through sterilizing cycles at least three times before use after complete recovery if the old soft contact lenses are to be used. Hard contact lenses should be stopped. They need to be thoroughly sterilized before use after recovery.  Itching and burning eyes due to allergies is often relieved by using a cool cloth applied to closed eye(s). SEEK MEDICAL CARE IF:   Your problems do not go away after two or three days of treatment.  Your lids are sticky (especially in the morning when you wake up) or stick together.  Discharge develops. Antibiotics may be needed either as drops, ointment, or by mouth.  You have extreme light sensitivity.  An oral temperature above 102 F (38.9 C) develops.  Pain in or around the eye or any other visual symptom develops. MAKE SURE YOU:   Understand these instructions.  Will watch your condition.  Will get help right away if you are not doing well or get worse. Document Released: 07/09/2002 Document Revised: 07/11/2011 Document Reviewed: 06/04/2007 [facility] Patient Information 2015 [place], [place]. This information is not intended to replace advice given to you by your health care provider. Make sure you discuss any questions you have with your health care provider.   Emergency Department  Resource Guide 1) Find a Doctor and Pay Out of Pocket Although you won't have to find out who is covered by your insurance plan, it is a good idea to ask around and get recommendations. You will then need to call the office and see if the doctor you have chosen will accept you as a new patient and what types of options they offer for patients who are self-pay. Some doctors offer discounts or will set up payment plans for their patients who do not have insurance, but you will need to ask so you aren't surprised when you get to your appointment.  2) Contact Your Local Health Department Not all health departments have doctors that can see patients for sick visits, but many do, so it is worth a call to see if yours does. If you don't know where your local health department is, you can check in your phone book. The CDC also has a tool to help you locate your state's health department, and many state websites also have listings of all of their local health departments.  3) Find a [facility] If your illness is not likely to be very severe or complicated, you may want to try a walk in clinic. These are popping up all over the country in pharmacies, drugstores, and shopping centers. They're usually staffed by nurse practitioners or physician assistants that have been  trained to treat common illnesses and complaints. They're usually fairly quick and inexpensive. However, if you have serious medical issues or chronic medical problems, these are probably not your best option.  No Primary Care Doctor: - Call Health Connect at  [phone] - they can help you locate a primary care doctor that  accepts your insurance, provides certain services, etc. - Physician Referral Service- [phone]  Chronic Pain Problems: Organization         Address  Phone   Notes  [facility]  [phone] Patients need to be referred by their primary care doctor.   Medication Assistance: Organization          Address  Phone   Notes  [facility] Medication [facility] [address] [place], [place] [address] [phone] --Must be a resident of [facility] -- Must have NO insurance coverage whatsoever (no Medicaid/ Medicare, etc.) -- The pt. MUST have a primary care doctor that directs their care regularly and follows them in the community   MedAssist  [phone]   [employer]  [phone]    Agencies that provide inexpensive medical care: Organization         Address  Phone   Notes  [place]  [phone]   [facility]    [phone]   [facility] [place], [place] [address] [phone]   [place] [address], [place] [phone]   Planned Parenthood    [phone]   [facility]    [phone]   [place] [address], [place] Phone:  [phone], Fax:  [phone] Hours of Operation:  9 am - 6 pm, M-F.  Also accepts Medicaid/Medicare and self-pay.  [facility] for [address], [address], [place] Phone: [phone], Fax: [phone]. Hours of Operation:  8:30 am - 5:30 pm, M-F.  Also accepts Medicaid and self-pay.  [facility] [place] [address], [place] Phone: [phone]   [place], [place], [place] [phone], Ext. [phone] Mondays & Thursdays: 7-9 AM.  First 15 patients are seen on a first come, first serve basis.    [place] Providers:  Organization         Address  Phone   Notes  [facility] [address], [place] [phone] Also accepts self-pay patients.  [facility] [address], [place]  [phone]   [place], [address], [place] [phone]   [facility] [address] [place] [phone]   Lucianne Lei [address], [place]   [phone] Only accepts [place] Access [place] patients after they have their name applied to their card.   Self-Pay (no insurance) in [facility]:  Organization         Address  Phone   Notes  Sickle Cell Patients, [facility] Internal Medicine [place] [phone]   [facility] [phone]   [facility] Urgent [place]  [place], [address], [place] [phone]   [facility]/Dr. Osei-Bonsu  [address], [place] or [place]  [address], [place] [phone] Phone number for both [facility] and [place] locations is the same.  Urgent Medical and [facility] [address] [place] [phone]   [facility] [address] [place] or [address] [phone]   [facility] [address] [place] [phone], phone; [phone], fax Sees patients 1st and 3rd Saturday of every month.  Must not qualify for public or private insurance (i.e. Medicaid, Medicare, [place] Health Choice, Veterans' Benefits)  Household income should be no more than 200% of the poverty level The clinic cannot treat you if you are pregnant or think you are pregnant  Sexually transmitted diseases are not treated at the clinic.    Dental Care: Organization         Address  Phone  Notes  [facility] Department of [facility] [place] [phone] Accepts children up to age 34 who are enrolled in [place] or [place]; pregnant women with a Medicaid card; and children who have applied for Medicaid or [place] Health Choice, but were declined, whose parents can pay a reduced fee at time of service.  [facility] Department of [facility]  [address], [place] [phone] Accepts children up to age 46 who are enrolled in [place] or [place]; pregnant women with a Medicaid card; and children who have applied for Medicaid or [place] Health Choice, but were declined, whose parents can pay a reduced fee at time of service.  [place] Adult Dental Access PROGRAM  [place] [phone] Patients are seen by appointment only. Walk-ins are not accepted. [place] will see patients 40 years of age and older. Monday - Tuesday (8am-5pm) Most Wednesdays (8:30-5pm) $30 per visit, cash only  [facility] Adult Dental Access PROGRAM  [address], [facility] [phone] Patients are seen by appointment only. Walk-ins are not accepted. [place] will see patients 44 years of age and older. One Wednesday Evening (Monthly: Volunteer Based).  $30 per visit, cash only  [place]  [phone] for adults; Children under age 57, call Graduate Pediatric Dentistry at [phone]. Children aged 60-14, please call [phone] to request a pediatric application.  Dental services are provided in all areas of dental care including fillings, crowns and bridges, complete and partial dentures, implants, gum treatment, root canals, and extractions. Preventive care is also provided. Treatment is provided to both adults and children. Patients are selected via a lottery and there is often a waiting list.   [facility] [address], [place]  [phone] [email or web address]   [facility] [address] [place], [place] [phone], Ext. [phone] Second and Fourth Thursday of each month, opens at 6:30 AM; Clinic ends at 9 AM.  Patients are seen on a first-come first-served basis, and a limited number are seen during each clinic.   [facility]  [address] Hillard Danker [place], [place] [phone]   Eligibility Requirements You must  have lived in [place], [place], or [place] counties for at least the last three months.   You cannot be eligible for state or federal sponsored [employer], including [employer], [place], or [employer].   You generally cannot be eligible for healthcare insurance through your employer.    How to apply: Eligibility screenings are  held every Tuesday and Wednesday afternoon from 1:00 pm until 4:00 pm. You do not need an appointment for the interview!  [facility] [address] [place], [place]   [place]  [place] Department  [place]  [phone]    Behavioral Health Resources in the Community: Intensive Outpatient Programs Organization         Address  Phone  Notes  [place]. [address], [place], [place] [phone]   [facility] Outpatient [address], [place], [place]   ADS: Alcohol & Drug S[facility]cs [address] [place], [place]   [place] [address],  [place], [place] or [phone]   Substance Abuse Resources Organization         Address  Phone  Notes  Alcohol and Drug Services  [phone]   [place]  [phone]   [facility] [place]   Chinita Pester  [phone]   Residential & Outpatient Substance Abuse Program  [phone]   Psychological Services Organization         Address  Phone  Notes  [facility] [place]  [place]  [phone]   [place] [address] [place] or [phone]    Mobile Crisis Teams Organization         Address  Phone  Notes  [facility], Mobile Crisis Care Unit  [phone]   [facility]  [address] [place], [place]   [place] [address] [place] [phone]    Self-Help/Support Groups Organization         Address  Phone             Notes  [place]. of [place] - variety of support groups  [place] Call for more information  Narcotics Anonymous (NA), [facility] [address], [employer] [place]  2 meetings at this location   Special educational needs teacher         Address  Phone  Notes  [facility] [place],    [place]  [phone]   [facility]  [address], [place] [address]15830, [place], [place]   [place], [place] [phone] Admissions: 8am-3pm M-F  Incentives Substance [place] [address],    [place], [place] [phone]   [facility] [address] [place], [place], [place]   [facility] [address]  [place], [place]   [facility] - Intensive Outpatient [address], [place], [place]   [facility] ([place].) [place].,  [place], [place] [phone] or [phone]   [facility] Services (RTS) [address] [place], [place] Accepts Medicaid  [facility] [address]  [place] [phone] Substance Abuse/Addiction Treatment   [facility] Organization         Address  Phone  Notes  [facility]  [phone]   Domenic Schwab, PhD [address] Arlis [place], [place]   [phone] or [phone]   [place]   [address] [place], [place] [phone]   [place] [address], [place], [place] [phone] Insurance/Medicaid/sponsorship through  [facility]  Faith and Families [address] [place], [place] [phone] [place], [place] [phone]    Dr. Adele Schilder  [phone]   [facility] Dept. 1) [address], [place] 2) [place] 3)  [place] [address], [place] [phone]  [phone]   [place] [phone] or [phone] (After Hours)

## 2014-08-06 NOTE — ED Provider Notes (Signed)
CSN: [id number]     Arrival date & time 08/06/14  0818 History   First MD Initiated Contact with Patient 08/06/14 [phone]     Chief Complaint  Patient presents with  . Conjunctivitis     (Consider location/radiation/quality/duration/timing/severity/associated sxs/prior Treatment) HPI Patient is a 29 year old male no past medical history presents the ER complaining of bilateral eye redness. Patient states over the past 3-4 days he has noticed redness to his eyes worsening of the past 24 hours with noted thick discharge this morning. Patient denies eye pain, change in visual acuity, blurred vision, dizziness, headache, nasal congestion, cough, otalgia. Patient denies any recent sick contacts.  History reviewed. No pertinent past medical history. History reviewed. No pertinent past surgical history. No family history on file. History  Substance Use Topics  . Smoking status: Never Smoker   . Smokeless tobacco: Not on file  . Alcohol Use: No    Review of Systems  Constitutional: Negative for fever.  Eyes: Positive for redness. Negative for photophobia and visual disturbance.  Respiratory: Negative for shortness of breath.   Cardiovascular: Negative for chest pain.  Gastrointestinal: Negative for nausea, vomiting and abdominal pain.  Genitourinary: Negative for dysuria.  Skin: Negative for rash.  Neurological: Negative for dizziness, syncope, weakness and numbness.  Psychiatric/Behavioral: Negative.       Allergies  Review of patient's allergies indicates no known allergies.  Home Medications   Prior to Admission medications   Not on File   BP 127/73 mmHg  Pulse 89  Temp(Src) 98.7 F (37.1 C) (Oral)  Resp 20  SpO2 100% Physical Exam  Constitutional: He is oriented to person, place, and time. He appears well-developed and well-nourished. No distress.  HENT:  Head: Normocephalic and atraumatic.  Eyes: EOM are normal. Pupils are equal, round, and reactive to light. Lids are  everted and swept, no foreign bodies found. Right eye exhibits no chemosis and no discharge. Left eye exhibits no chemosis and no discharge. Right conjunctiva is injected. Right conjunctiva has no hemorrhage. Left conjunctiva is injected. Left conjunctiva has no hemorrhage. No scleral icterus. Right eye exhibits normal extraocular motion and no nystagmus. Left eye exhibits normal extraocular motion and no nystagmus. Pupils are equal.  Slit lamp exam:      The right eye shows no corneal abrasion, no corneal flare, no corneal ulcer, no foreign body, no hyphema, no hypopyon, no fluorescein uptake and no anterior chamber bulge.       The left eye shows no corneal abrasion, no corneal flare, no corneal ulcer, no foreign body, no hyphema, no hypopyon, no fluorescein uptake and no anterior chamber bulge.  Bilateral bulbar conjunctival erythema. Mild swelling to eyelids upper and lower bilaterally. EOMs intact without pain or discomfort. No photophobia. No consensual photophobia.  Neck: Normal range of motion.  Pulmonary/Chest: Effort normal. No respiratory distress.  Musculoskeletal: Normal range of motion.  Neurological: He is alert and oriented to person, place, and time.  Skin: Skin is warm and dry. He is not diaphoretic.  Psychiatric: He has a normal mood and affect.  Nursing note and vitals reviewed.   ED Course  Procedures (including critical care time) Labs Review Labs Reviewed - No data to display  Imaging Review No results found.   EKG Interpretation None      MDM   Final diagnoses:  Bilateral conjunctivitis    Patient presentation consistent with an allergic versus bacterial conjunctivitis.  Mild purulent discharge, no corneal abrasions, entrapment, consensual photophobia, or dendritic staining  with fluorescein study.  Visual acuity normal. Presentation non-concerning for iritis, corneal abrasions, or HSV.  Patient to be discharged with antibiotic drops as well as steroid drops  for possible allergic conjunctivitis.  Personal hygiene and frequent handwashing discussed.  Patient advised to followup with ophthalmologist if symptoms persist or worsen in any way including vision change or purulent discharge.  Patient verbalizes understanding and is agreeable with discharge. Encouraged patient to call or return to the ER with any worsening of symptoms or should he have any questions or concerns.  BP 127/73 mmHg  Pulse 89  Temp(Src) 98.7 F (37.1 C) (Oral)  Resp 20  SpO2 100%  Signed,  Dahlia Bailiff, PA-C 5:54 PM  Patient seen and discussed with Dr. Nat Christen, M.D.   Dahlia Bailiff, PA-C 08/06/14 [place], MD 08/07/14 [phone]

## 2014-08-07 ENCOUNTER — Encounter: Payer: Self-pay | Admitting: Gastroenterology

## 2014-08-19 ENCOUNTER — Encounter: Payer: Self-pay | Admitting: Gastroenterology

## 2014-08-19 ENCOUNTER — Emergency Department (HOSPITAL_COMMUNITY)
Admission: EM | Admit: 2014-08-19 | Discharge: 2014-08-19 | Disposition: A | Discharge status: Home / Self Care | Payer: BLUE CROSS/BLUE SHIELD | Attending: Emergency Medicine | Admitting: Emergency Medicine

## 2014-08-19 ENCOUNTER — Encounter (HOSPITAL_COMMUNITY): Payer: Self-pay | Admitting: Emergency Medicine

## 2014-08-19 DIAGNOSIS — Z862 Personal history of diseases of the blood and blood-forming organs and certain disorders involving the immune mechanism: Secondary | ICD-10-CM | POA: Diagnosis not present

## 2014-08-19 DIAGNOSIS — H109 Unspecified conjunctivitis: Secondary | ICD-10-CM | POA: Insufficient documentation

## 2014-08-19 DIAGNOSIS — K088 Other specified disorders of teeth and supporting structures: Secondary | ICD-10-CM | POA: Insufficient documentation

## 2014-08-19 DIAGNOSIS — Z8781 Personal history of (healed) traumatic fracture: Secondary | ICD-10-CM | POA: Insufficient documentation

## 2014-08-19 DIAGNOSIS — Z8619 Personal history of other infectious and parasitic diseases: Secondary | ICD-10-CM | POA: Insufficient documentation

## 2014-08-19 DIAGNOSIS — K0889 Other specified disorders of teeth and supporting structures: Secondary | ICD-10-CM

## 2014-08-19 MED ORDER — POLYMYXIN B-TRIMETHOPRIM 10000-0.1 UNIT/ML-% OP SOLN: 1.0000 [drp] | OPHTHALMIC | Status: DC

## 2014-08-19 MED ORDER — PENICILLIN V POTASSIUM 500 MG PO TABS: 500.0000 mg | ORAL_TABLET | Freq: Four times a day (QID) | ORAL | Status: DC

## 2014-08-19 MED ORDER — TRAMADOL HCL 50 MG PO TABS: 100.0000 mg | ORAL_TABLET | Freq: Four times a day (QID) | ORAL | Status: DC | PRN

## 2014-08-19 MED ORDER — PENICILLIN V POTASSIUM 250 MG PO TABS
500.0000 mg | ORAL_TABLET | Freq: Once | ORAL | Status: AC
  Administered 2014-08-19: 500 mg via ORAL
  Filled 2014-08-19: qty 2

## 2014-08-19 MED ORDER — TRAMADOL HCL 50 MG PO TABS
100.0000 mg | ORAL_TABLET | Freq: Once | ORAL | Status: AC
  Administered 2014-08-19: 100 mg via ORAL
  Filled 2014-08-19: qty 2

## 2014-08-19 NOTE — Discharge Instructions (Signed)
Take Veetid as directed until gone. Take Tramadol as needed for pain. Follow up with Dr. Junita Push for further evaluation. Refer to attached documents for more information.
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
## 2014-08-19 NOTE — ED Notes (Signed)
Patient states back tooth on lower right side.  Patient states unsure if it's his wisdom tooth coming in or if it's a decayed/broken tooth.   Patient also complains of bilateral conjunctivitis that hasn't completely cleared up.   Patient states needs new prescription for antibx drops.

## 2014-09-16 ENCOUNTER — Encounter: Payer: Self-pay | Admitting: Gastroenterology

## 2014-09-16 ENCOUNTER — Ambulatory Visit (INDEPENDENT_AMBULATORY_CARE_PROVIDER_SITE_OTHER): Payer: BLUE CROSS/BLUE SHIELD | Admitting: Gastroenterology

## 2014-09-16 VITALS — BP 124/60 | HR 60 | Ht 71.75 in | Wt 167.2 lb

## 2014-09-16 DIAGNOSIS — K269 Duodenal ulcer, unspecified as acute or chronic, without hemorrhage or perforation: Secondary | ICD-10-CM

## 2014-09-16 DIAGNOSIS — R1033 Periumbilical pain: Secondary | ICD-10-CM | POA: Diagnosis not present
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
## 2014-09-16 NOTE — Patient Instructions (Addendum)
You will be set up for a CT scan of abdomen and pelvis with IV and oral contrast for intermittent abd pains, duodenal ulcer, ? Crohn's disease.  You are scheduled for a CT scan at 09/18/2014 at 1:45 at [facility] to 1st floor radiology at [facility] to pick up the contrast today.

## 2014-09-18 ENCOUNTER — Ambulatory Visit (HOSPITAL_COMMUNITY)
Admission: RE | Admit: 2014-09-18 | Discharge: 2014-09-18 | Disposition: A | Discharge status: Home / Self Care | Payer: BLUE CROSS/BLUE SHIELD | Source: Ambulatory Visit | Attending: Gastroenterology | Admitting: Gastroenterology

## 2014-09-18 DIAGNOSIS — D649 Anemia, unspecified: Secondary | ICD-10-CM | POA: Insufficient documentation

## 2014-09-18 DIAGNOSIS — R1033 Periumbilical pain: Secondary | ICD-10-CM | POA: Insufficient documentation

## 2014-09-18 DIAGNOSIS — K269 Duodenal ulcer, unspecified as acute or chronic, without hemorrhage or perforation: Secondary | ICD-10-CM | POA: Diagnosis not present

## 2014-09-18 MED ORDER — IOHEXOL 300 MG/ML  SOLN
100.0000 mL | Freq: Once | INTRAMUSCULAR | Status: AC | PRN
  Administered 2014-09-18: 100 mL via INTRAVENOUS

## 2014-09-24 DIAGNOSIS — K5090 Crohn's disease, unspecified, without complications: Secondary | ICD-10-CM | POA: Insufficient documentation

## 2014-09-25 ENCOUNTER — Telehealth: Payer: Self-pay | Admitting: Gastroenterology

## 2014-09-25 NOTE — Telephone Encounter (Signed)
FYI:  Dr Ardis Hughs please advise

## 2014-09-25 NOTE — Telephone Encounter (Signed)
No, we can go with humira instead  Would like to start with usual dosing;   160mg  [place] on day 1 80mg  [facility] on day 15 40mg   on day 29 Then 40mg   every 2 weeks.  thanks

## 2014-09-26 NOTE — Telephone Encounter (Signed)
Taylor Burke with [facility] is getting the humira started and will call back with any questions

## 2014-09-26 NOTE — Telephone Encounter (Signed)
Left message with [facility]

## 2014-10-14 ENCOUNTER — Telehealth: Payer: Self-pay | Admitting: Gastroenterology

## 2014-10-14 NOTE — Telephone Encounter (Signed)
[facility] called and I spoke with Lars Mage she is going to call Roderic Palau with the [employer] program to set up teaching.  Pt is aware and will call with any further concerns

## 2014-12-08 ENCOUNTER — Ambulatory Visit: Payer: Self-pay | Admitting: Gastroenterology

## 2014-12-18 ENCOUNTER — Telehealth: Payer: Self-pay

## 2014-12-18 NOTE — Telephone Encounter (Signed)
The pt walked in to the office with [employer] pt assistance forms and wanted me to complete them and fax to [employer].  The [employer] nurse Rip Harbour had suggested he do this because he lost his job and has no insurance.  I made copies of the forms and gave the pt the copies and kept the original.  I called [phone] [facility] and asked if they need to have the forms and take care of the assistance since they are the one who completed the initial insurance and ambassador program.  I spoke with Rodman Key at [facility] and he asked me to have the forms faxed to him at [phone]. Rodman Key with [facility] will call with any further information needed and he will also contact the pt as soon as a decision is made.

## 2014-12-22 ENCOUNTER — Telehealth: Payer: Self-pay | Admitting: Gastroenterology

## 2014-12-23 NOTE — Telephone Encounter (Signed)
I spoke with the pharmacist Shirlee Limerick at [facility] and verified the rx, she will get that taken care of today.  Humira 40 mg [place] every 2 weeks with 3 refills

## 2015-01-07 ENCOUNTER — Telehealth: Payer: Self-pay | Admitting: Gastroenterology

## 2015-01-07 NOTE — Telephone Encounter (Signed)
Called patient to see how long he has been off the Humira but the phone number was busy. Will try to contact patient later.

## 2015-01-08 NOTE — Telephone Encounter (Signed)
Pt stopped Humira the end of July because of an insurance issue, he has gotten humira approved again and has received the medication.  Do you want him to start with 1 injection every other week, this is what he was on prior to the mix up with insurance or does he need the induction dose?

## 2015-01-08 NOTE — Telephone Encounter (Signed)
Yes, one dose every other week.  ROV next available with me.  thanks

## 2015-01-08 NOTE — Telephone Encounter (Signed)
Pt has been notified and ROV scheduled 

## 2015-03-09 ENCOUNTER — Ambulatory Visit: Payer: Self-pay | Admitting: Gastroenterology

## 2015-05-18 ENCOUNTER — Other Ambulatory Visit: Payer: Self-pay

## 2015-05-18 ENCOUNTER — Encounter: Payer: Self-pay | Admitting: Gastroenterology

## 2015-05-18 ENCOUNTER — Ambulatory Visit (INDEPENDENT_AMBULATORY_CARE_PROVIDER_SITE_OTHER): Payer: BLUE CROSS/BLUE SHIELD | Admitting: Gastroenterology

## 2015-05-18 VITALS — BP 90/58 | HR 56 | Ht 71.75 in | Wt 166.4 lb

## 2015-05-18 DIAGNOSIS — K5090 Crohn's disease, unspecified, without complications: Secondary | ICD-10-CM

## 2015-05-18 DIAGNOSIS — K5000 Crohn's disease of small intestine without complications: Secondary | ICD-10-CM

## 2015-05-18 MED ORDER — NA SULFATE-K SULFATE-MG SULF 17.5-3.13-1.6 GM/177ML PO SOLN: 1.0000 | Freq: Once | ORAL | Status: DC

## 2015-05-18 NOTE — Patient Instructions (Signed)
You will be set up for a colonoscopy. You will be set up for an upper endoscopy. For Crohn's disease staging. Stay on humira once every two weeks. Please return to see Dr. Ardis Hughs in 6 months.
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
## 2015-06-05 ENCOUNTER — Ambulatory Visit (AMBULATORY_SURGERY_CENTER): Payer: Self-pay | Admitting: Internal Medicine

## 2015-06-05 ENCOUNTER — Encounter: Payer: Self-pay | Admitting: Internal Medicine

## 2015-06-05 VITALS — BP 111/55 | HR 42 | Temp 97.9°F | Resp 18 | Ht 71.75 in | Wt 166.0 lb

## 2015-06-05 DIAGNOSIS — K2950 Unspecified chronic gastritis without bleeding: Secondary | ICD-10-CM

## 2015-06-05 DIAGNOSIS — K2970 Gastritis, unspecified, without bleeding: Secondary | ICD-10-CM

## 2015-06-05 DIAGNOSIS — D120 Benign neoplasm of cecum: Secondary | ICD-10-CM

## 2015-06-05 DIAGNOSIS — D125 Benign neoplasm of sigmoid colon: Secondary | ICD-10-CM

## 2015-06-05 DIAGNOSIS — K529 Noninfective gastroenteritis and colitis, unspecified: Secondary | ICD-10-CM

## 2015-06-05 DIAGNOSIS — K269 Duodenal ulcer, unspecified as acute or chronic, without hemorrhage or perforation: Secondary | ICD-10-CM

## 2015-06-05 DIAGNOSIS — K5080 Crohn's disease of both small and large intestine without complications: Secondary | ICD-10-CM

## 2015-06-05 DIAGNOSIS — K2980 Duodenitis without bleeding: Secondary | ICD-10-CM

## 2015-06-05 MED ORDER — SODIUM CHLORIDE 0.9 % IV SOLN: 500.0000 mL | INTRAVENOUS | Status: DC

## 2015-06-05 NOTE — Op Note (Signed)
[place]  [address] [place], [address]   COLONOSCOPY PROCEDURE REPORT  PATIENT: Taylor, Burke  MR#: [id number] BIRTHDATE: [birth year] , 29  yrs. old GENDER: male ENDOSCOPIST: Jerene Bears, MD REFERRED [id number] Ardis Hughs, M.D. PROCEDURE DATE:  06/05/2015 PROCEDURE:   Colonoscopy, diagnostic, Colonoscopy with biopsy, and Colonoscopy with snare polypectomy First Screening Colonoscopy - Avg.  risk and is 50 yrs.  old or older - No.  Prior Negative Screening - Now for repeat screening. N/A  History of Adenoma - Now for follow-up colonoscopy & has been > or = to 3 yrs.  N/A  Polyps removed today? Yes ASA CLASS:   Class II INDICATIONS:history of Crohn's disease of the upper GI tract by EGD and terminal ileum by CT scan for disease assessment, 1st colonoscopy. MEDICATIONS: Monitored anesthesia care and Propofol 500 mg IV  DESCRIPTION OF PROCEDURE:   After the risks benefits and alternatives of the procedure were thoroughly explained, informed consent was obtained.  The digital rectal exam revealed no rectal mass.   The LB [id number]  endoscope was introduced through the anus and advanced to the terminal ileum which was intubated for a short distance. No adverse events experienced.   The quality of the prep was good.  (Suprep was used)  The instrument was then slowly withdrawn as the colon was fully examined. Estimated blood loss is zero unless otherwise noted in this procedure report.   COLON FINDINGS: Mild ileitis in the most distal terminal ileum (<5 cm); Multiple biopsies.   More proximal ileum normal in the examined segments.   Polypoid mucosa at IC valve; polypectomy with cold snare. Resection and retrieval complete   Polypoid mucosa in the ascending colon (5 cm distal to and on the same wall as ICV); multiple biopsies (possible related to prior inflammation).   5 mm distal sigmoid polyp removed with cold snare.  Resection and retrieval complete.    Otherwise normal colonic mucosa throughout the colon with no evidence of active inflammation/Crohn's disease. Retroflexed views revealed no abnormalities. The time to cecum = 3.0 Withdrawal time = 14.8   The scope was withdrawn and the procedure completed.  COMPLICATIONS: There were no immediate complications.    ENDOSCOPIC IMPRESSION: 1.   Mild ileitis in the most distal terminal ileum (<5 cm); Multiple biopsies 2.   More proximal ileum normal in the examined segments 3.   Polypoid mucosa at IC valve; polypectomy with cold snare 4.   Polypoid mucosa in the ascending valve; multiple biopsies 5.   5 mm distal sigmoid polyp removed with cold snare.  Resection and retrieval complete 6.   Otherwise normal colonic mucosa throughout the colon with no evidence of active inflammation/Crohn's disease  RECOMMENDATIONS: 1.  Await pathology results 2.  Avoid all NSAIDs 3.  Continue Humira 40 mg every 14 days 4.  Follow-up with Dr.  Ardis Hughs  eSigned:  Jerene Bears, MD 06/05/2015 4:15 PM   cc: the patient, Dr. Ardis Hughs, primary care provider   PATIENT NAME:  Taylor Burke, Taylor Burke MR#: [id number]

## 2015-06-05 NOTE — Progress Notes (Signed)
To recovery, report to Scott, RN, VSS 

## 2015-06-05 NOTE — Progress Notes (Signed)
Called to room to assist during endoscopic procedure.  Patient ID and intended procedure confirmed with present staff. Received instructions for my participation in the procedure from the performing physician.  

## 2015-06-05 NOTE — Op Note (Signed)
[place]  [address]. [place], [address]   ENDOSCOPY PROCEDURE REPORT  PATIENT: Taylor Burke, Taylor Burke  MR#: [id number] BIRTHDATE: 04/14/1986 , 29  yrs. old GENDER: male ENDOSCOPIST: Jerene Bears, MD REFERRED BY:  Owens Loffler, M.D. PROCEDURE DATE:  06/05/2015 PROCEDURE:  EGD, diagnostic and EGD w/ biopsy ASA CLASS:     Class II INDICATIONS:  history of upper GI tract Crohn's disease and Crohn's of the terminal ileum by CT scan for assessment of disease activity/staging, on Humira, last EGD Sept 2015. MEDICATIONS: Monitored anesthesia care and Propofol 300 mg IV TOPICAL ANESTHETIC: none  DESCRIPTION OF PROCEDURE: After the risks benefits and alternatives of the procedure were thoroughly explained, informed consent was obtained.  The LB [id number] endoscope was introduced through the mouth and advanced to the second portion of the duodenum , Without limitations.  The instrument was slowly withdrawn as the mucosa was fully examined.  ESOPHAGUS: The mucosa of the esophagus appeared normal.   Z-line regular at 40 cm.  STOMACH: Mild nodular gastritis (inflammation) was found in the gastric body and gastric antrum without ulceration. There were a few scattered erosions.  Multiple biopsies were performed using cold forceps.  DUODENUM: Edema and erythema in the duodenal bulb; multiple biopsies to evaluate for Crohn's disease.   Scattered superficial ulceration in the second portion of the duodenum likely related to Crohn's disease; multiple biopsies.  Retroflexed views revealed no abnormalities.     The scope was then withdrawn from the patient and the procedure completed.  COMPLICATIONS: There were no immediate complications.  ENDOSCOPIC IMPRESSION: 1.   The mucosa of the esophagus appeared normal 2.   Mild gastritis (inflammation) was found in the gastric body and gastric antrum; multiple biopsies were performed 3.   Edema and erythema in the  duodenal bulb; multiple biopsies to evaluate for Crohn's disease 4.   Scattered superficial ulceration in the second portion of the duodenum likely related to Crohn's disease; multiple biopsies  RECOMMENDATIONS: 1.  Await biopsy results 2.  Continue Humira therapy 3.  Follow-up with Dr.  Ardis Hughs 4.  Proceed to colonoscopy  eSigned:  Jerene Bears, MD 06/05/2015 3:57 PM     CC:the patient, Dr. Ardis Hughs, PCP

## 2015-06-05 NOTE — Patient Instructions (Addendum)

## 2015-06-08 ENCOUNTER — Telehealth: Payer: Self-pay

## 2015-06-08 NOTE — Telephone Encounter (Signed)
  Follow up Call-  Call back number 06/05/2015  Post procedure Call Back phone  # [phone]  Permission to leave phone message Yes    Patient was called for follow up after procedure on 05/05/2015. No answer at the number given for follow up phone call. A message was left on the answering machine.

## 2015-06-15 ENCOUNTER — Encounter: Payer: Self-pay | Admitting: Gastroenterology

## 2015-07-01 DEATH — deceased

## 2015-07-03 ENCOUNTER — Encounter: Payer: Self-pay | Admitting: Gastroenterology

## 2015-07-28 ENCOUNTER — Ambulatory Visit: Payer: Self-pay | Admitting: Gastroenterology

## 2015-08-30 IMAGING — CT CT ABD-PELV W/ CM
2 of 4 series · 15 of 46 positions shown, 17 images · IV contrast (Omni 300)
Comparison: Clinic note of 09/16/2014.

CLINICAL DATA: Periumbilical abdominal pain. Duodenal ulcer.
Question Crohn disease. Anemia.

EXAM:
CT ABDOMEN AND PELVIS WITH CONTRAST
TECHNIQUE: Multidetector CT imaging of the abdomen and pelvis was performed
using the standard protocol following bolus administration of
intravenous contrast.
CONTRAST:  100mL OMNIPAQUE IOHEXOL 300 MG/ML  SOLN

[Series 2: abd/ pelvis 5.0 i30f 1 · axial · 0.70mm/px · z∈[+1126,+1516]mm · 12 of 90 slices shown, 14 images]
[im 8/90  soft-tissue]
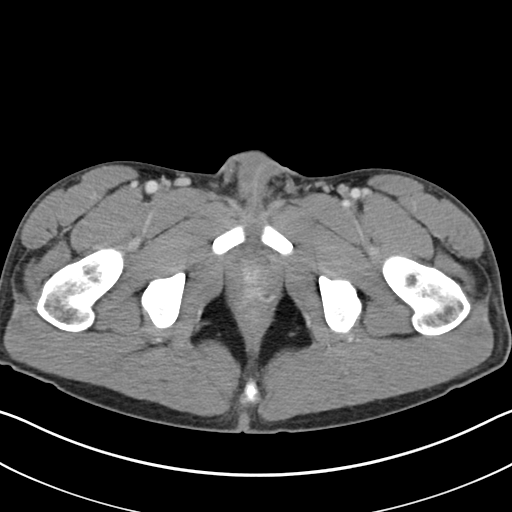
[im 8/90  bone]
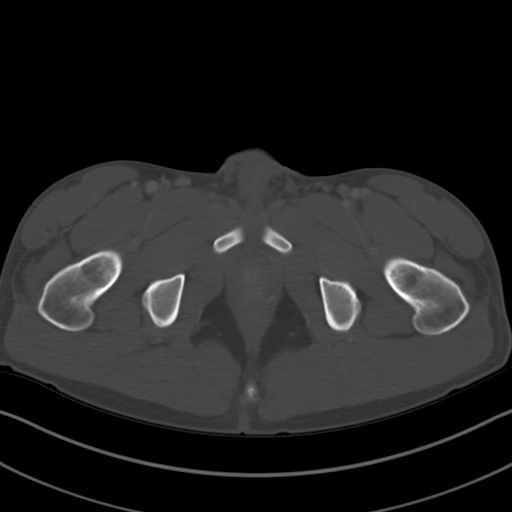
[im 15/90  soft-tissue]
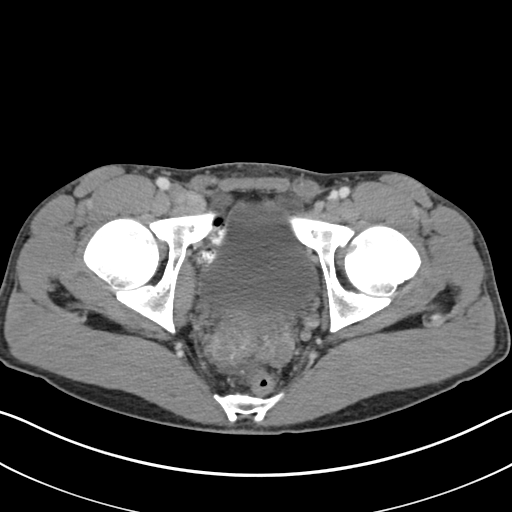
[im 22/90  soft-tissue]
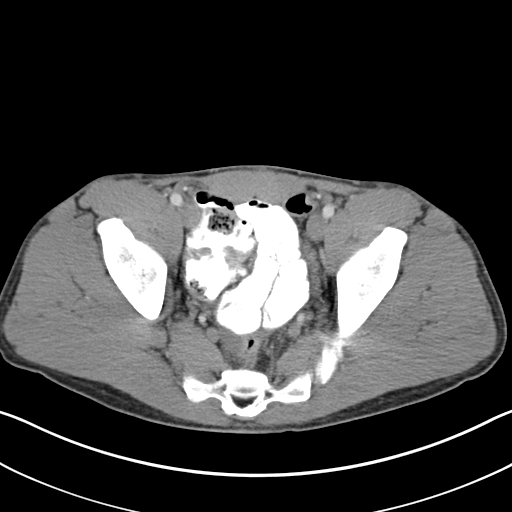
[im 29/90  soft-tissue]
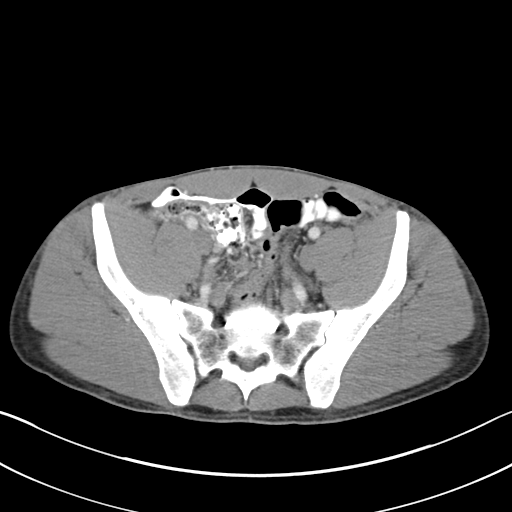
[im 36/90  soft-tissue]
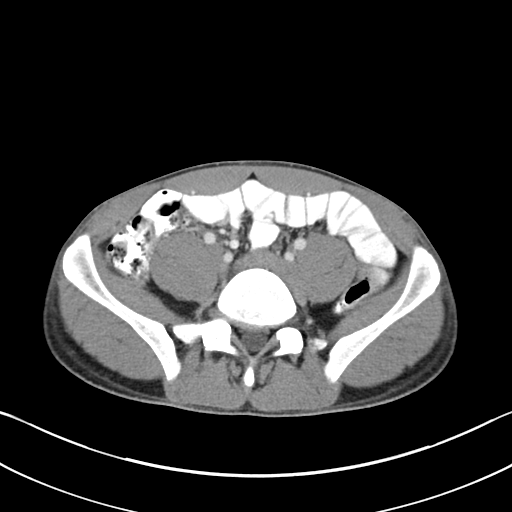
[im 43/90  soft-tissue]
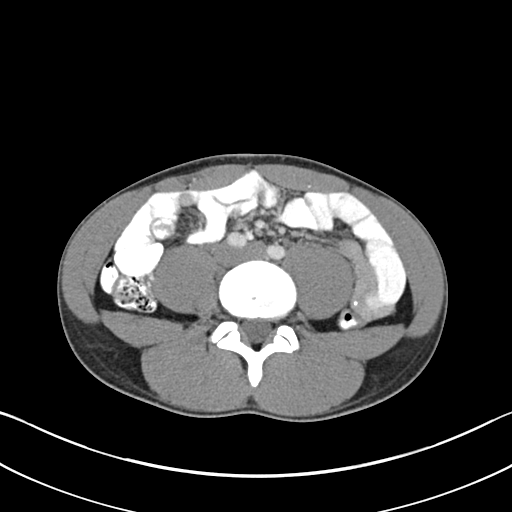
[im 50/90  soft-tissue]
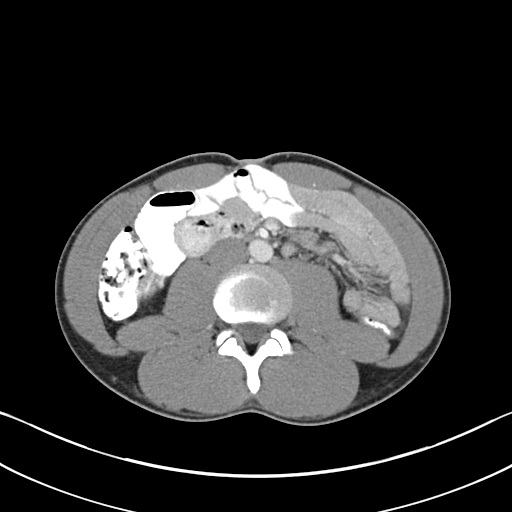
[im 57/90  soft-tissue]
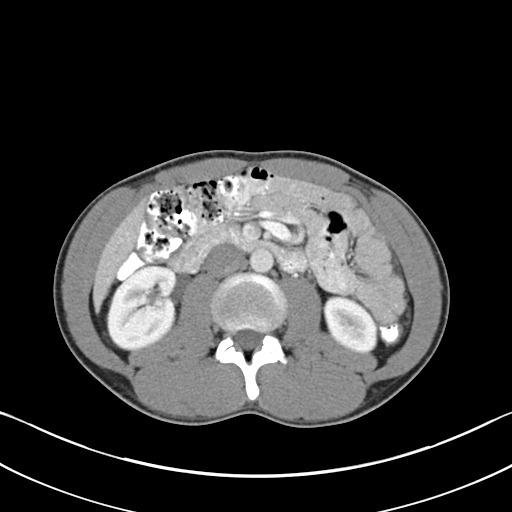
[im 65/90  soft-tissue]
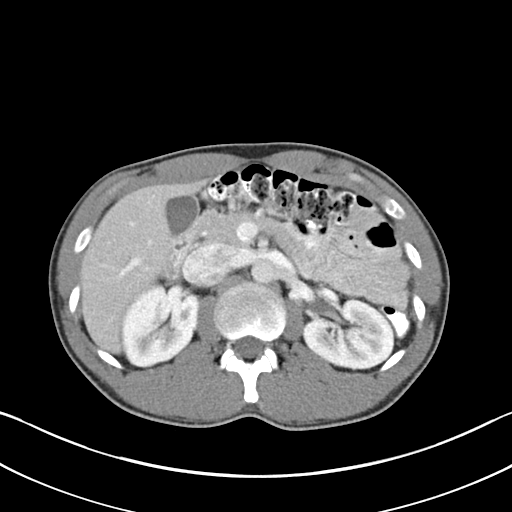
[im 65/90  bone]
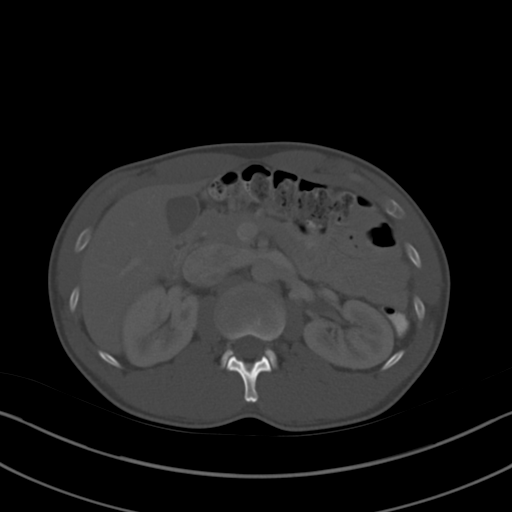
[im 72/90  soft-tissue]
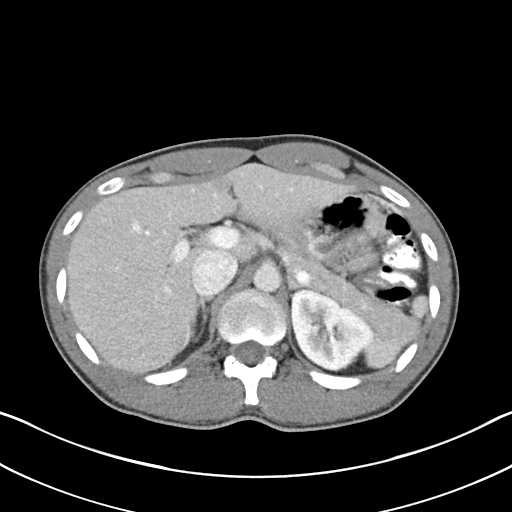
[im 79/90  soft-tissue]
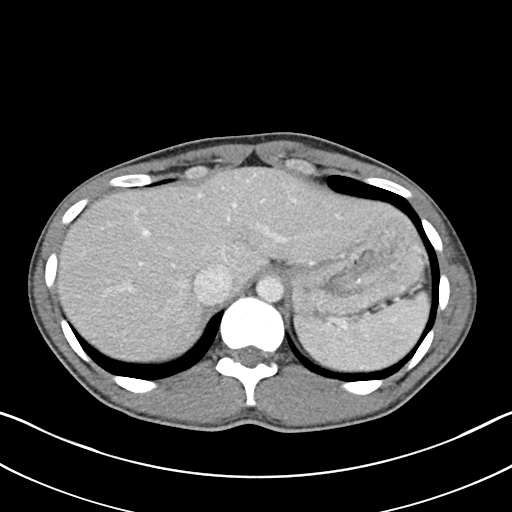
[im 86/90  soft-tissue]
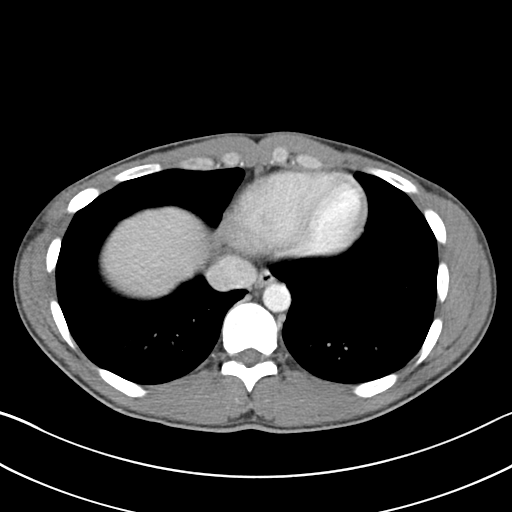

[Series 5: coronals · coronal · 0.70mm/px · 3 of 100 slices shown]
[im 34/100  soft-tissue]
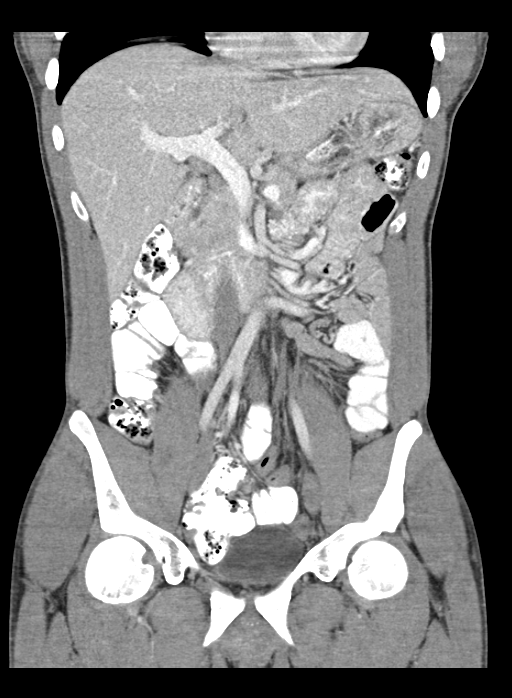
[im 45/100  soft-tissue]
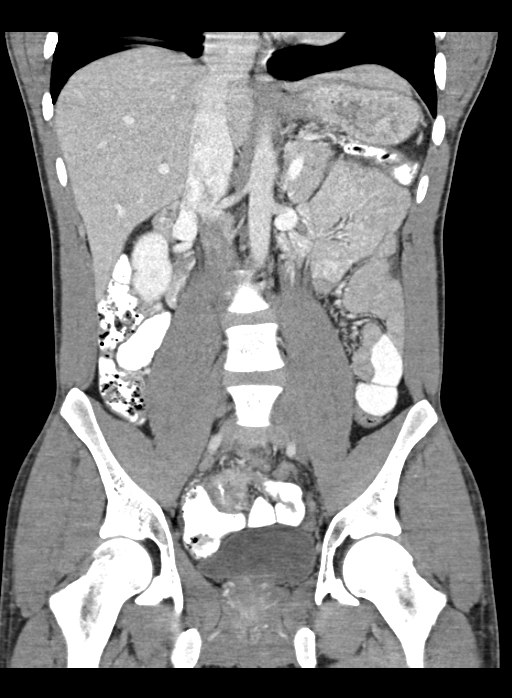
[im 56/100  soft-tissue]
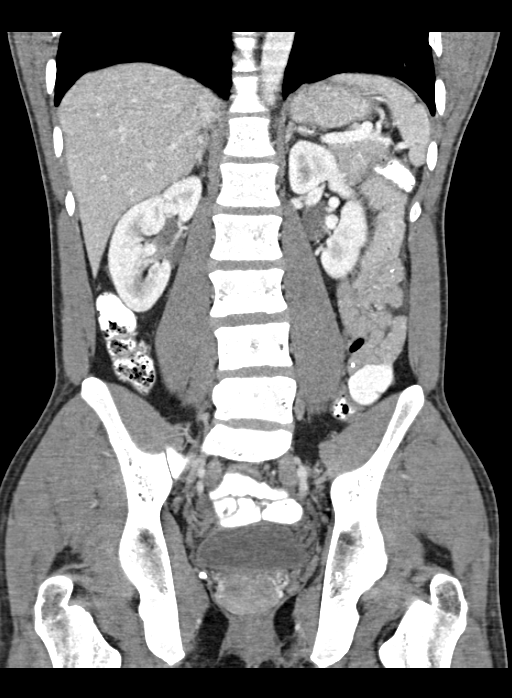

[15 of 46 positions shown; findings below may reference images not displayed]

FINDINGS: Lower chest: Motion degradation at the lung bases. Clear lung bases.
Normal heart size without pericardial or pleural effusion.

Hepatobiliary: Tiny hepatic dome lesion which is too small to
characterize. Normal gallbladder, without biliary ductal dilatation.

Pancreas: Normal, without mass or ductal dilatation.

Spleen: Normal

Adrenals/Urinary Tract: Normal adrenal glands. Normal kidneys,
without hydronephrosis. Normal urinary bladder.

Stomach/Bowel: Normal stomach, without wall thickening. No evidence
of colonic wall thickening. The cecum extends into the central right
pelvis. The appendix may be diminutive on coronal image 53. No
pericecal inflammation is seen. Wall thickening is identified at the
terminal ileum, including on image 66 of series 2 and coronal image
44. No mucosal hyperenhancement. No surrounding mesenteric
inflammation. No obstruction.

Small bowel is normal in caliber, without other sites of wall
thickening.

Vascular/Lymphatic: Normal caliber of the aorta and branch vessels.
No retroperitoneal or retrocrural adenopathy. No pelvic sidewall
adenopathy. Mildly prominent ileocolic mesenteric nodes, including
at 8 mm on coronal image 40 and transverse image 66.

Reproductive: Normal prostate.

Other: Trace cul-de-sac fluid is nonspecific but abnormal on image
74.

Musculoskeletal: Normal sacroiliac joints. Trace L5-S1
retrolisthesis with loss of intervertebral disc height at this
level. Disc bulges at L4-5 and L5-S1.
IMPRESSION: Focal wall thickening of the terminal ileum with adjacent prominent
nodes. No surrounding edema or mucosal hyper enhancement. Given the
clinical history, findings are suspicious for Crohn disease,
possibly inactive or chronic. No obstruction or other acute
complication.
